# Patient Record
Sex: Male | Born: 1980 | Race: White | Hispanic: No | Marital: Single | Smoking: Former smoker
Health system: Southern US, Community
[De-identification: ages and names within clinical notes are randomized; demographics above are authoritative.]

## PROBLEM LIST (undated history)

## (undated) DIAGNOSIS — C6291 Malignant neoplasm of right testis, unspecified whether descended or undescended: Secondary | ICD-10-CM

## (undated) DIAGNOSIS — IMO0002 Reserved for concepts with insufficient information to code with codable children: Secondary | ICD-10-CM

## (undated) DIAGNOSIS — C779 Secondary and unspecified malignant neoplasm of lymph node, unspecified: Secondary | ICD-10-CM

## (undated) DIAGNOSIS — N5089 Other specified disorders of the male genital organs: Secondary | ICD-10-CM

---

## 2003-06-29 ENCOUNTER — Emergency Department (HOSPITAL_COMMUNITY): Admission: EM | Admit: 2003-06-29 | Discharge: 2003-06-29 | Payer: Self-pay | Admitting: Emergency Medicine

## 2004-02-16 ENCOUNTER — Emergency Department (HOSPITAL_COMMUNITY): Admission: EM | Admit: 2004-02-16 | Discharge: 2004-02-16 | Payer: Self-pay | Admitting: Emergency Medicine

## 2006-02-14 ENCOUNTER — Emergency Department (HOSPITAL_COMMUNITY): Admission: EM | Admit: 2006-02-14 | Discharge: 2006-02-14 | Payer: Self-pay | Admitting: Emergency Medicine

## 2007-09-24 ENCOUNTER — Emergency Department (HOSPITAL_COMMUNITY): Admission: EM | Admit: 2007-09-24 | Discharge: 2007-09-24 | Payer: Self-pay | Admitting: Emergency Medicine

## 2007-09-25 ENCOUNTER — Emergency Department (HOSPITAL_COMMUNITY): Admission: EM | Admit: 2007-09-25 | Discharge: 2007-09-26 | Payer: Self-pay | Admitting: Emergency Medicine

## 2007-10-15 ENCOUNTER — Emergency Department (HOSPITAL_COMMUNITY): Admission: EM | Admit: 2007-10-15 | Discharge: 2007-10-15 | Payer: Self-pay | Admitting: Emergency Medicine

## 2008-03-08 ENCOUNTER — Emergency Department (HOSPITAL_COMMUNITY): Admission: EM | Admit: 2008-03-08 | Discharge: 2008-03-08 | Payer: Self-pay | Admitting: Emergency Medicine

## 2008-09-11 ENCOUNTER — Emergency Department (HOSPITAL_COMMUNITY): Admission: EM | Admit: 2008-09-11 | Discharge: 2008-09-11 | Payer: Self-pay | Admitting: Emergency Medicine

## 2008-09-11 ENCOUNTER — Encounter: Payer: Self-pay | Admitting: Orthopedic Surgery

## 2008-09-23 ENCOUNTER — Emergency Department (HOSPITAL_COMMUNITY): Admission: EM | Admit: 2008-09-23 | Discharge: 2008-09-23 | Payer: Self-pay | Admitting: Emergency Medicine

## 2008-10-11 ENCOUNTER — Ambulatory Visit: Payer: Self-pay | Admitting: Orthopedic Surgery

## 2008-10-11 DIAGNOSIS — S93409A Sprain of unspecified ligament of unspecified ankle, initial encounter: Secondary | ICD-10-CM | POA: Insufficient documentation

## 2008-10-19 ENCOUNTER — Encounter: Payer: Self-pay | Admitting: Orthopedic Surgery

## 2008-12-04 ENCOUNTER — Emergency Department (HOSPITAL_COMMUNITY): Admission: EM | Admit: 2008-12-04 | Discharge: 2008-12-04 | Payer: Self-pay | Admitting: Emergency Medicine

## 2009-01-27 ENCOUNTER — Emergency Department (HOSPITAL_COMMUNITY): Admission: EM | Admit: 2009-01-27 | Discharge: 2009-01-27 | Payer: Self-pay | Admitting: Emergency Medicine

## 2009-10-06 ENCOUNTER — Emergency Department (HOSPITAL_COMMUNITY): Admission: EM | Admit: 2009-10-06 | Discharge: 2009-10-06 | Payer: Self-pay | Admitting: Emergency Medicine

## 2009-12-21 ENCOUNTER — Emergency Department (HOSPITAL_COMMUNITY): Admission: EM | Admit: 2009-12-21 | Discharge: 2009-12-22 | Payer: Self-pay | Admitting: Emergency Medicine

## 2010-03-05 ENCOUNTER — Emergency Department (HOSPITAL_COMMUNITY): Admission: EM | Admit: 2010-03-05 | Discharge: 2010-03-05 | Payer: Self-pay | Admitting: Emergency Medicine

## 2010-03-18 ENCOUNTER — Emergency Department (HOSPITAL_COMMUNITY): Admission: EM | Admit: 2010-03-18 | Discharge: 2010-03-19 | Payer: Self-pay | Admitting: Emergency Medicine

## 2010-11-26 LAB — GC/CHLAMYDIA PROBE AMP, GENITAL: GC Probe Amp, Genital: NEGATIVE

## 2010-11-26 LAB — WOUND CULTURE

## 2010-12-17 LAB — RAPID STREP SCREEN (MED CTR MEBANE ONLY): Streptococcus, Group A Screen (Direct): POSITIVE — AB

## 2010-12-19 LAB — DIFFERENTIAL
Monocytes Relative: 11 % (ref 3–12)
Neutro Abs: 7.9 10*3/uL — ABNORMAL HIGH (ref 1.7–7.7)

## 2010-12-19 LAB — MONONUCLEOSIS SCREEN: Mono Screen: NEGATIVE

## 2010-12-19 LAB — CBC
HCT: 41.8 % (ref 39.0–52.0)
Hemoglobin: 14.6 g/dL (ref 13.0–17.0)
Platelets: 154 10*3/uL (ref 150–400)
RBC: 4.81 MIL/uL (ref 4.22–5.81)
WBC: 11.2 10*3/uL — ABNORMAL HIGH (ref 4.0–10.5)

## 2010-12-19 LAB — RAPID STREP SCREEN (MED CTR MEBANE ONLY): Streptococcus, Group A Screen (Direct): NEGATIVE

## 2013-06-13 ENCOUNTER — Encounter (HOSPITAL_COMMUNITY): Payer: Self-pay | Admitting: *Deleted

## 2013-06-13 ENCOUNTER — Emergency Department (HOSPITAL_COMMUNITY)
Admission: EM | Admit: 2013-06-13 | Discharge: 2013-06-13 | Disposition: A | Discharge status: Home / Self Care | Payer: BC Managed Care – PPO | Attending: Emergency Medicine | Admitting: Emergency Medicine

## 2013-06-13 DIAGNOSIS — Y9389 Activity, other specified: Secondary | ICD-10-CM | POA: Insufficient documentation

## 2013-06-13 DIAGNOSIS — F17200 Nicotine dependence, unspecified, uncomplicated: Secondary | ICD-10-CM | POA: Insufficient documentation

## 2013-06-13 DIAGNOSIS — X500XXA Overexertion from strenuous movement or load, initial encounter: Secondary | ICD-10-CM | POA: Insufficient documentation

## 2013-06-13 DIAGNOSIS — Y9289 Other specified places as the place of occurrence of the external cause: Secondary | ICD-10-CM | POA: Insufficient documentation

## 2013-06-13 DIAGNOSIS — M549 Dorsalgia, unspecified: Secondary | ICD-10-CM

## 2013-06-13 DIAGNOSIS — IMO0002 Reserved for concepts with insufficient information to code with codable children: Secondary | ICD-10-CM | POA: Insufficient documentation

## 2013-06-13 DIAGNOSIS — Z8739 Personal history of other diseases of the musculoskeletal system and connective tissue: Secondary | ICD-10-CM | POA: Insufficient documentation

## 2013-06-13 DIAGNOSIS — Y990 Civilian activity done for income or pay: Secondary | ICD-10-CM | POA: Insufficient documentation

## 2013-06-13 HISTORY — DX: Reserved for concepts with insufficient information to code with codable children: IMO0002

## 2013-06-13 MED ORDER — HYDROCODONE-ACETAMINOPHEN 5-325 MG PO TABS: 2.0000 | ORAL_TABLET | ORAL | Status: DC | PRN

## 2013-06-13 MED ORDER — PREDNISONE 10 MG PO TABS: ORAL_TABLET | ORAL | Status: DC

## 2013-06-13 NOTE — ED Notes (Signed)
Pain rt buttock and down rt leg . Pt was pushing a wheel barrow with gravel in it.  Wheel barrow turned over and pt hurt his back app230p.    Took motrin without relief.

## 2013-06-13 NOTE — ED Notes (Signed)
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 States hx of back pain but not since 2012, from herniated disc.

## 2013-06-13 NOTE — ED Provider Notes (Signed)
CSN: [id number]     Arrival date & time 06/13/13  1558 History   First MD Initiated Contact with Patient 06/13/13 1722     Chief Complaint  Patient presents with  . Back Pain   (Consider location/radiation/quality/duration/timing/severity/associated sxs/prior Treatment) Patient is a 32 y.o. male presenting with back pain. The history is provided by the patient. No language interpreter was used.  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  Pt reports pain down leg today while working.   Past Medical History  Diagnosis Date  . Herniated disc    History reviewed. No pertinent past surgical history. No family history on file. History  Substance Use Topics  . Smoking status: Current Every Day Smoker    Types: Cigarettes  . Smokeless tobacco: Not on file  . Alcohol Use: No    Review of Systems  Musculoskeletal: Positive for back pain.  All other systems reviewed and are negative.    Allergies  Review of patient's allergies indicates no known allergies.  Home Medications  No current outpatient prescriptions on file. BP 139/96  Pulse 87  Temp(Src) 97.6 F (36.4 C) (Oral)  Resp 18  Ht 5\' 10"  (1.778 m)  Wt 185 lb (83.915 kg)  BMI 26.54 kg/m2  SpO2 100% Physical Exam  Constitutional: He appears well-developed and well-nourished.  HENT:  Head: Normocephalic.  Cardiovascular: Normal rate.   Pulmonary/Chest: Effort normal.  Abdominal: Soft.  Musculoskeletal: Normal range of motion. He exhibits no tenderness.  Tender lower ls spine  Neurological: He is alert. He has normal reflexes.  Skin: Skin is warm.  Psychiatric: He has a normal mood and affect.     ED Course  Procedures (including critical care time) Labs Review Labs Reviewed - No data to display Imaging Review No results found.  MDM   1. Back pain   prednisone taper,  hydrocodone    Elson Areas, PA-C 06/13/13 2131

## 2013-06-15 NOTE — ED Provider Notes (Signed)
Medical screening examination/treatment/procedure(s) were performed by non-physician practitioner and as supervising physician I was immediately available for consultation/collaboration.   Delphin Funes L Darlean Warmoth, MD 06/15/13 0804 

## 2014-01-25 ENCOUNTER — Emergency Department (HOSPITAL_COMMUNITY): Payer: BC Managed Care – PPO

## 2014-01-25 ENCOUNTER — Encounter (HOSPITAL_COMMUNITY): Payer: Self-pay | Admitting: Emergency Medicine

## 2014-01-25 ENCOUNTER — Emergency Department (HOSPITAL_COMMUNITY)
Admission: EM | Admit: 2014-01-25 | Discharge: 2014-01-25 | Disposition: A | Discharge status: Home / Self Care | Payer: BC Managed Care – PPO | Attending: Emergency Medicine | Admitting: Emergency Medicine

## 2014-01-25 ENCOUNTER — Emergency Department (HOSPITAL_COMMUNITY): Payer: Self-pay

## 2014-01-25 DIAGNOSIS — Z8739 Personal history of other diseases of the musculoskeletal system and connective tissue: Secondary | ICD-10-CM | POA: Insufficient documentation

## 2014-01-25 DIAGNOSIS — F17200 Nicotine dependence, unspecified, uncomplicated: Secondary | ICD-10-CM | POA: Insufficient documentation

## 2014-01-25 DIAGNOSIS — M545 Low back pain, unspecified: Secondary | ICD-10-CM | POA: Insufficient documentation

## 2014-01-25 DIAGNOSIS — R5381 Other malaise: Secondary | ICD-10-CM | POA: Insufficient documentation

## 2014-01-25 DIAGNOSIS — IMO0002 Reserved for concepts with insufficient information to code with codable children: Secondary | ICD-10-CM | POA: Insufficient documentation

## 2014-01-25 DIAGNOSIS — R5383 Other fatigue: Secondary | ICD-10-CM

## 2014-01-25 DIAGNOSIS — Z791 Long term (current) use of non-steroidal anti-inflammatories (NSAID): Secondary | ICD-10-CM | POA: Insufficient documentation

## 2014-01-25 DIAGNOSIS — Z79899 Other long term (current) drug therapy: Secondary | ICD-10-CM | POA: Insufficient documentation

## 2014-01-25 DIAGNOSIS — N508 Other specified disorders of male genital organs: Secondary | ICD-10-CM | POA: Insufficient documentation

## 2014-01-25 DIAGNOSIS — N5089 Other specified disorders of the male genital organs: Secondary | ICD-10-CM

## 2014-01-25 LAB — URINALYSIS, ROUTINE W REFLEX MICROSCOPIC
Bilirubin Urine: NEGATIVE
GLUCOSE, UA: NEGATIVE mg/dL
HGB URINE DIPSTICK: NEGATIVE
Ketones, ur: NEGATIVE mg/dL
LEUKOCYTES UA: NEGATIVE
NITRITE: NEGATIVE
PROTEIN: NEGATIVE mg/dL
SPECIFIC GRAVITY, URINE: 1.01 (ref 1.005–1.030)
Urobilinogen, UA: 0.2 mg/dL (ref 0.0–1.0)
pH: 5.5 (ref 5.0–8.0)

## 2014-01-25 NOTE — ED Provider Notes (Signed)
CSN: [id number]     Arrival date & time 01/25/14  1306 History  This chart was scribed for Jeremy Norrie, MD by Ludger Nutting, ED Scribe. This patient was seen in room APA18/APA18 and the patient's care was started 2:10 PM.    Chief Complaint  Patient presents with  . Groin Swelling  . Back Pain      The history is provided by the patient. No language interpreter was used.    HPI Comments: Jeremy Clay is a 33 y.o. male who presents to the Emergency Department complaining of 8-9 months of constant, worsening right testicular swelling and hardness. He denies having any testicular pain at any point since onset.    [place] has had LBP [place] showed a herniated disc with pain radiating down his right leg, which is now gone. He does however continue to have LBP.   He also reports he noticed moveable lumps to the lower right back which he states are not painful. He states the back pain is usually resolved with ibuprofen. He has a herniated disc which occasionally causes him to have radiating pain to the right leg. He states his current back pain is different from this. Patient states he also noticed knots to the lower abdomen in the last couple of weeks. He reports decreased urine output with dark colored urine but states he has been drinking adequate water. He also reports becoming easily fatigued while working. He denies nausea, vomiting, decreased appetite, fever, chills.   No PCP   Past Medical History  Diagnosis Date  . Herniated disc    History reviewed. No pertinent past surgical history. Family History  Problem Relation Age of Onset  . Diabetes Other   . Heart failure Other    History  Substance Use Topics  . Smoking status: Current Every Day Smoker -- 0.50 packs/day for 13 years    Types: Cigarettes  . Smokeless tobacco: Never Used  . Alcohol Use: No  employed at a dog [employer] He smokes a 0.5 ppd and no longer drinks  alcohol. He has history of cocaine and marijuana use. He states last use of these drugs was about 6 months ago.   Review of Systems  Constitutional: Positive for fatigue. Negative for fever, chills and appetite change.  Gastrointestinal: Negative for nausea and vomiting.  Genitourinary: Positive for decreased urine volume and scrotal swelling.  Musculoskeletal: Positive for back pain.  All other systems reviewed and are negative.     Allergies  Review of patient's allergies indicates no known allergies.  Home Medications   Prior to Admission medications   Medication Sig Start Date End Date Taking? Authorizing Provider  Aspirin-Acetaminophen (GOODYS BODY PAIN PO) Take 1 packet by mouth daily as needed (pain).   Yes Historical Provider, MD  ibuprofen (ADVIL,MOTRIN) 200 MG tablet Take 600 mg by mouth every 6 (six) hours as needed for moderate pain.   Yes Historical Provider, MD  naproxen sodium (ANAPROX) 220 MG tablet Take 220 mg by mouth 2 (two) times daily with a meal.   Yes Historical Provider, MD  predniSONE (DELTASONE) 10 MG tablet 6,6,5,5,4,4,3,3,2,2,1,1 06/13/13   Fransico Meadow, PA-C   BP 135/83  Pulse 77  Temp(Src) 98.3 F (36.8 C) (Oral)  Resp 16  Ht 5\' 10"  (1.778 m)  Wt 185 lb (83.915 kg)  BMI 26.54 kg/m2  SpO2 100%  Vital signs normal   Physical Exam  Nursing note and vitals reviewed. Constitutional: He is oriented to person, place, and time. He appears well-developed and well-nourished.  Non-toxic appearance. He does not appear ill. No distress.  HENT:  Head: Normocephalic and atraumatic.  Right Ear: External ear normal.  Left Ear: External ear normal.  Nose: Nose normal. No mucosal edema or rhinorrhea.  Mouth/Throat: Oropharynx is clear and moist and mucous membranes are normal. No dental abscesses or uvula swelling.  Eyes: Conjunctivae and EOM are normal. Pupils are equal, round, and reactive to light.  Neck: Normal range of motion and full passive range of  motion without pain. Neck supple.  Cardiovascular: Normal rate, regular rhythm and normal heart sounds.  Exam reveals no gallop and no friction rub.   No murmur heard. Pulmonary/Chest: Effort normal and breath sounds normal. No respiratory distress. He has no wheezes. He has no rhonchi. He has no rales. He exhibits no tenderness and no crepitus.  Abdominal: Soft. Normal appearance and bowel sounds are normal. He exhibits no distension. There is no tenderness. There is no rebound and no guarding.  Genitourinary:  Right testicle is large and swollen, approximately the size of an orange. It is firm, smooth and transilluminates in small area. Left testicle feels normal and is normal size.    Musculoskeletal: Normal range of motion. He exhibits no edema and no tenderness.       Back:  Moves all extremities well.   Neurological: He is alert and oriented to person, place, and time. He has normal strength. No cranial nerve deficit.  Skin: Skin is warm, dry and intact. No rash noted. No erythema. No pallor.  Patient has trouble finding the knots in his abdomen. He did finally find one which felt like a firm linear growth in the subcutaneous tissue consistent with a fatty deposit. He also had one in the back area on the left lower lumbar region.  Psychiatric: He has a normal mood and affect. His speech is normal and behavior is normal. His mood appears not anxious.    ED Course  Procedures (including critical care time)  DIAGNOSTIC STUDIES: Oxygen Saturation is 100% on RA, normal by my interpretation.    COORDINATION OF CARE: 2:10 PM Discussed treatment plan with pt at bedside and pt agreed to plan.  16:33 Dr Thornton Papas, called [place] result.   16:47-18:25 no response from Dr Michela Pitcher  18:17 Dr Ronette Deter NP took his information and the office will call him in the morning.    Labs Review Results for orders placed during the hospital encounter of 01/25/14  URINALYSIS, ROUTINE W REFLEX MICROSCOPIC       Result Value Ref Range   Color, Urine YELLOW  YELLOW   APPearance CLEAR  CLEAR   Specific Gravity, Urine 1.010  1.005 - 1.030   pH 5.5  5.0 - 8.0   Glucose, UA NEGATIVE  NEGATIVE mg/dL   Hgb urine dipstick NEGATIVE  NEGATIVE   Bilirubin Urine NEGATIVE  NEGATIVE   Ketones, ur NEGATIVE  NEGATIVE mg/dL   Protein, ur NEGATIVE  NEGATIVE mg/dL   Urobilinogen, UA 0.2  0.0 - 1.0 mg/dL   Nitrite NEGATIVE  NEGATIVE   Leukocytes, UA NEGATIVE  NEGATIVE   Laboratory interpretation all normal    Imaging Review US Scrotum  US Art/ven Flow Abd Pelv Doppler  01/25/2014   CLINICAL DATA:  RIGHT testicular enlargement for 9 months, nonpainful, hard smooth feel on palpation, groin swelling  EXAM: SCROTAL ULTRASOUND  DOPPLER ULTRASOUND OF THE TESTICLES  TECHNIQUE:  Complete ultrasound examination of the testicles, epididymis, and other scrotal structures was performed. Color and spectral Doppler ultrasound were also utilized to evaluate blood flow to the testicles.  COMPARISON:  None.  FINDINGS: Right testicle  Measurements: Enlarged, 9.1 x 6.1 x 8.4 cm. Abnormal appearance, demonstrating extensive microlithiasis, cystic areas, solid components, and areas of mixed isoechogenicity and hypoechogenicity. Largest cystic component 2.3 x 1.3 x 2.2 cm. Largest discrete solid component measures 1.7 x 1.9 x 1.6 cm. Blood flow present within the enlarged RIGHT testis on color Doppler imaging.  Left testicle  Measurements: 4.7 x 2.2 x 3.8 cm. Diffuse microlithiasis. No discrete mass. Internal blood flow present on color Doppler imaging.  Right epididymis:  Not visualized  Left epididymis:  Normal in size and appearance.  Hydrocele:  Small LEFT hydrocele.  No RIGHT hydrocele.  Varicocele:  No definite varicocele identified  Pulsed Doppler interrogation of both testes demonstrates low resistance arterial and venous waveforms in the LEFT testis. Low resistance arterial and venous waveforms are present within the enlarged abnormal  appearing RIGHT testis as well.  IMPRESSION: Normal LEFT testis.  Small LEFT hydrocele.  Markedly enlarged heterogeneous RIGHT testis 9.1 x 6.1 x 8.4 cm containing solid areas, cystic areas, microcalcifications, and nonspecific heterogeneous areas of mixed echogenicity.  Lesion is highly suspicious for testicular neoplasm.  Appearance is atypical for advanced tubular ectasia of the rete testis.  Granulomatous processes could cause a similar appearance but considered much less likely.  Urologic evaluation recommended.  Findings called to Dr.  Rolland Porter on 01/25/2014 at 1635 hr.   Electronically Signed   By: Lavonia Dana M.D.   On: 01/25/2014 16:43     EKG Interpretation None      MDM   Final diagnoses:  Testicular mass    Plan discharge  Rolland Porter, MD, FACEP    I personally performed the services described in this documentation, which was scribed in my presence. The recorded information has been reviewed and considered.  Rolland Porter, MD, Abram Sander   Jeremy Norrie, MD 01/25/14 2141

## 2014-01-25 NOTE — ED Notes (Signed)
Patient c/o right testicle swelling x8 months that has progressively gotten worse. Patient denies any testicular pain but reports pain in lower back. Per patient has hx of herniated disc but states pain does not feel the same.

## 2014-01-25 NOTE — Discharge Instructions (Signed)
You should hear from [facility] in the morning about an appointment to be seen about getting further treatment of the mass in your right testicle. Return to the ED if you get pain in your testicle.     Scrotal Masses Scrotal swelling is common in men of all ages. Common types of testicular masses include:   Hydrocele. The most common benign testicular mass in an adult. Hydroceles are generally soft and painless collections of fluid in the scrotal sac. These can rapidly change size as the fluid enters or leaves. Hydroceles can be associated with an underlying cancer of the testicle.  Spermatoceles. Generally soft and painless cyst-like masses in the scrotum that contain fluid, usually above the testicle. They can rapidly change size as the fluid enters or leaves. They are more prominent while standing or exercising. Sometimes, spermatoceles may cause a sensation of heaviness or a dull ache.  Orchitis. Inflammation of the testicle. It is painful and may be associated with a fever or symptoms of a urinary tract infection, including frequent and painful urination. It is common in males who have the mumps.  Varicocele. An enlargement of the veins that drain the testicles. Varicoceles usually occur on the left side of the scrotum. This condition can increase the risk of infertility. Varicocele is sometimes more prominent while standing or exercising. Sometimes, varicoceles may cause a sensation of heaviness or a dull ache.  Inguinal hernia. A bulge caused by a portion of intestine protruding into the scrotum through a weak area in the abdominal muscles. Hernias may or may not be painful. They are soft and usually enlarge with coughing or straining.  Torsion of the testis. This can cause a testicular mass that develops quickly and is associated with tenderness or fever, or both. It is caused by a twisting of the testicle within the sac. It also reduces the blood supply and can destroy the testis if  not treated quickly with surgery.  Epididymitis. Inflammation of the epididymis (a structure attached above and behind the testicle), usually caused by a urinary tract infection or a sexually transmitted infection. This generally shows up as testicular discomfort and swelling and may include pain during urination. It is frequently associated with a testicle infection.  Testicular appendages. Remnants of tissue on the testis present since birth. A testicular appendage can twist on its blood supply and cause pain. In most cases, this is seen as a blue dot on the scrotum.  Hematocele. A collection of blood between the layers of the sac inside the scrotum. It usually is caused by trauma to the scrotum.  Sebaceous cysts. These can be a swelling in the skin of the scrotum and are usually painless.  Cancer (carcinoma) of the skin of the scrotum. It can cause scrotal swelling, but this is rare. Document Released: 03/01/2003 Document Revised: 04/27/2013 Document Reviewed: 02/14/2013 [facility] Patient Information 2014 [place].

## 2014-01-27 ENCOUNTER — Other Ambulatory Visit: Payer: Self-pay | Admitting: Urology

## 2014-01-27 ENCOUNTER — Ambulatory Visit (INDEPENDENT_AMBULATORY_CARE_PROVIDER_SITE_OTHER): Payer: Self-pay | Admitting: Urology

## 2014-01-27 DIAGNOSIS — N508 Other specified disorders of male genital organs: Secondary | ICD-10-CM

## 2014-01-27 DIAGNOSIS — N478 Other disorders of prepuce: Secondary | ICD-10-CM

## 2014-01-27 DIAGNOSIS — N471 Phimosis: Secondary | ICD-10-CM

## 2014-01-31 ENCOUNTER — Other Ambulatory Visit: Payer: Self-pay | Admitting: Urology

## 2014-01-31 ENCOUNTER — Ambulatory Visit (HOSPITAL_COMMUNITY)
Admission: RE | Admit: 2014-01-31 | Discharge: 2014-01-31 | Disposition: A | Discharge status: Home / Self Care | Payer: Self-pay | Source: Ambulatory Visit | Attending: Urology | Admitting: Urology

## 2014-01-31 DIAGNOSIS — N508 Other specified disorders of male genital organs: Secondary | ICD-10-CM

## 2014-02-02 ENCOUNTER — Telehealth: Payer: Self-pay | Admitting: Oncology

## 2014-02-02 ENCOUNTER — Encounter (HOSPITAL_BASED_OUTPATIENT_CLINIC_OR_DEPARTMENT_OTHER): Payer: Self-pay | Admitting: *Deleted

## 2014-02-02 ENCOUNTER — Ambulatory Visit (HOSPITAL_COMMUNITY)
Admission: RE | Admit: 2014-02-02 | Discharge: 2014-02-02 | Disposition: A | Discharge status: Home / Self Care | Payer: Self-pay | Source: Ambulatory Visit | Attending: Urology | Admitting: Urology

## 2014-02-02 DIAGNOSIS — C6290 Malignant neoplasm of unspecified testis, unspecified whether descended or undescended: Secondary | ICD-10-CM | POA: Insufficient documentation

## 2014-02-02 DIAGNOSIS — C772 Secondary and unspecified malignant neoplasm of intra-abdominal lymph nodes: Secondary | ICD-10-CM | POA: Insufficient documentation

## 2014-02-02 DIAGNOSIS — N508 Other specified disorders of male genital organs: Secondary | ICD-10-CM

## 2014-02-02 MED ORDER — IOHEXOL 300 MG/ML  SOLN
100.0000 mL | Freq: Once | INTRAMUSCULAR | Status: AC | PRN
  Administered 2014-02-02: 100 mL via INTRAVENOUS

## 2014-02-02 NOTE — Telephone Encounter (Signed)
C/D 02/02/14 for appt. 02/22/14 °

## 2014-02-02 NOTE — Telephone Encounter (Signed)
LEFT MESSAGE FOR PATIENT TO RETURN CALL TO SCHEDULE NP APPT.  °

## 2014-02-02 NOTE — Telephone Encounter (Signed)
S/W PATIENT AND GAVE NP APPT FOR 06/17 @ 10:30 W/DR. SHADAD.  Iron DX- TESTICULAR CA W/RETROPERITIONAL NODE WELCOME PACKET MAILED.

## 2014-02-03 ENCOUNTER — Other Ambulatory Visit: Payer: Self-pay | Admitting: Urology

## 2014-02-06 NOTE — H&P (Signed)
1. Phimosis (605)  2. Testicular mass (608.89)  History of Present Illness   Jeremy Clay is a 33 yo WM who is sent from [facility] for a right testicular mass.   He was seen in the ER and was found to have a 9cm right testicular mass that is supicious for cancer.   He has had no pain but has noticed the swelling for the last six months.  He has had no voiding complaints.  He has had no inguinal swelling.  he has noticed a spot under the skin on the right lower back.   He has had no weight loss or malaise.   Past Medical History  1. History of Herniated lumbar intervertebral disc (722.10)  2. History of balanitis (V13.89)  3. History of bronchitis (V12.69)  4. History of hypertension (V12.59)  5. History of methicillin resistant Staphylococcus aureus infection (V12.04)  Surgical History  1. History of No Surgical Problems  Current Meds  1. Ibuprofen TABS;  Therapy: (Recorded:22May2015) to Recorded  Allergies  1. No Known Drug Allergies  Family History  1. Family history of Death : Father  Social History   Caffeine use (V49.89)   4 p/d   Occupation   painting   Single   Smokes cigarettes (305.1)   1/2 pk. p/d on and off 17 yrs.  Review of Systems Genitourinary, constitutional, skin, eye, otolaryngeal, hematologic/lymphatic, cardiovascular, pulmonary, endocrine, musculoskeletal, gastrointestinal, neurological and psychiatric system(s) were reviewed and pertinent findings if present are noted.  Genitourinary: post-void dribbling and testicular mass.  Respiratory: shortness of breath and cough.  Musculoskeletal: back pain (achy).  Neurological: dizziness.  Psychiatric: anxiety.    Vitals Vital Signs [Data Includes: Last 1 Day]  Recorded: [id number] 01:43PM  Height: 5 ft 10 in Weight: 185 lb  BMI Calculated: 26.54 BSA Calculated: 2.02 Blood Pressure: 166 / 138 Temperature: 97.7 F Heart Rate: 84  Physical Exam Constitutional: Well nourished and well developed . No  acute distress.  ENT:. The ears and nose are normal in appearance.  Neck: The appearance of the neck is normal and no neck mass is present.  Pulmonary: No respiratory distress and normal respiratory rhythm and effort.  Cardiovascular: Heart rate and rhythm are normal . No peripheral edema.  Abdomen: The abdomen is soft and nontender. No masses are palpated. No CVA tenderness. No hernias are palpable. No hepatosplenomegaly noted.  Genitourinary: Examination of the penis demonstrates phimosis (moderately severe. Can't retract beyond the glans. ), but no discharge, no masses, no lesions and a normal meatus. The penis is uncircumcised. The scrotum is without lesions. The left epididymis is palpably normal and non-tender. The right testis is found to have a 9 cm right testicular mass, but non-tender. The left testis is non-tender and without masses.  Lymphatics: The posterior cervical, supraclavicular, axillary, femoral and inguinal nodes are not enlarged or tender.  Skin: Normal skin turgor, no visible rash and no visible skin lesions.  Neuro/Psych:. Mood and affect are appropriate.    Results/Data Urine [Data Includes: Last 1 Day]   [id number]  COLOR YELLOW   APPEARANCE CLEAR   SPECIFIC GRAVITY >1.030   pH 6.0   GLUCOSE NEG mg/dL  BILIRUBIN NEG   KETONE NEG mg/dL  BLOOD NEG   PROTEIN TRACE mg/dL  UROBILINOGEN 0.2 mg/dL  NITRITE NEG   LEUKOCYTE ESTERASE NEG    Old records or history reviewed: I have reviewed his ER records.  The following images/tracing/specimen were independently visualized:  Scrotal US and report  reviewed.  The following clinical lab reports were reviewed:  UA reviewed.    Assessment  1. Testicular mass (608.89)  2. 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 He has moderately severe phimosis.   Plan Balanitis   1. UA With REFLEX; [Do Not Release]; Status:Resulted - Requires Verification;   Done:  [id number]  01:55PM Testicular mass   2. ALPHA-FETOPROTIEN (TUMOR MARKER); Status:Hold For - Specimen/Data  Collection,Appointment; Requested for:22May2015;   3. BETA HCG TUMOR MARKER; Status:Hold For - Specimen/Data Collection,Appointment;  Requested for:22May2015;   4. CBC W/DIFF; Status:Hold For - Specimen/Data Collection,Appointment; Requested  for:22May2015;   5. CHEST X-RAY; Status:Hold For - Appointment,Print,Records,Given To Patient; Requested  for:22May2015;   6. COMPREHENSIVE METABOLIC PANEL; Status:Hold For - Specimen/Data  Collection,Appointment; Requested for:22May2015;   7. CT-ABD/PELVIS WITH CONTRAST; Status:Hold For - Appointment,PreCert,Date of  Service,Print; Requested for:22May2015;   8. Follow-up Schedule Surgery Office  Follow-up  Status: Hold For - Appointment   Requested for: [phone]   I am going to get an AFP and BHCG along with a CBC and CMP. He will be set up for a CXR and CT AP with contrast. He will be scheduled for a right radical orchiectomy and is not interested in a prosthesis or circumcision at this time.   I reviewed the risks of bleeding, infection, hernia, nerve injury, fertility issues, need for additional treatment or surgery, thrombotic events and anesthetic complications.

## 2014-02-06 NOTE — Progress Notes (Addendum)
UNABLE TO [place] CELL #[id number]. NPO AFTER MN. ARRIVE AT 0600.

## 2014-02-06 NOTE — Anesthesia Preprocedure Evaluation (Signed)
Anesthesia Evaluation  Patient identified by MRN, date of birth, ID band Patient awake    Reviewed: Allergy & Precautions, H&P , NPO status , Patient's Chart, lab work & pertinent test results  Airway Mallampati: I TM Distance: >3 FB Neck ROM: Full    Dental  (+) Dental Advisory Given   Pulmonary Current Smoker,  breath sounds clear to auscultation        Cardiovascular negative cardio ROS  Rhythm:Regular Rate:Normal     Neuro/Psych negative neurological ROS  negative psych ROS   GI/Hepatic negative GI ROS, Neg liver ROS,   Endo/Other  negative endocrine ROS  Renal/GU negative Renal ROS     Musculoskeletal negative musculoskeletal ROS (+)   Abdominal   Peds  Hematology negative hematology ROS (+)   Anesthesia Other Findings   Reproductive/Obstetrics                           Anesthesia Physical Anesthesia Plan  ASA: II  Anesthesia Plan: General   Post-op Pain Management:    Induction: Intravenous  Airway Management Planned: LMA  Additional Equipment:   Intra-op Plan:   Post-operative Plan: Extubation in OR  Informed Consent: I have reviewed the patients History and Physical, chart, labs and discussed the procedure including the risks, benefits and alternatives for the proposed anesthesia with the patient or authorized representative who has indicated his/her understanding and acceptance.   Dental advisory given  Plan Discussed with: CRNA  Anesthesia Plan Comments:         Anesthesia Quick Evaluation

## 2014-02-07 ENCOUNTER — Ambulatory Visit (HOSPITAL_BASED_OUTPATIENT_CLINIC_OR_DEPARTMENT_OTHER): Payer: Self-pay | Admitting: Anesthesiology

## 2014-02-07 ENCOUNTER — Encounter (HOSPITAL_BASED_OUTPATIENT_CLINIC_OR_DEPARTMENT_OTHER): Payer: Self-pay | Admitting: *Deleted

## 2014-02-07 ENCOUNTER — Ambulatory Visit (HOSPITAL_BASED_OUTPATIENT_CLINIC_OR_DEPARTMENT_OTHER)
Admission: RE | Admit: 2014-02-07 | Discharge: 2014-02-07 | Disposition: A | Discharge status: Home / Self Care | Payer: Self-pay | Source: Ambulatory Visit | Attending: Urology | Admitting: Urology

## 2014-02-07 ENCOUNTER — Encounter (HOSPITAL_BASED_OUTPATIENT_CLINIC_OR_DEPARTMENT_OTHER)
Admission: RE | Disposition: A | Discharge status: Home / Self Care | Payer: Self-pay | Source: Ambulatory Visit | Attending: Urology

## 2014-02-07 ENCOUNTER — Encounter (HOSPITAL_BASED_OUTPATIENT_CLINIC_OR_DEPARTMENT_OTHER): Payer: Self-pay | Admitting: Anesthesiology

## 2014-02-07 DIAGNOSIS — M5126 Other intervertebral disc displacement, lumbar region: Secondary | ICD-10-CM | POA: Insufficient documentation

## 2014-02-07 DIAGNOSIS — C6291 Malignant neoplasm of right testis, unspecified whether descended or undescended: Secondary | ICD-10-CM | POA: Diagnosis present

## 2014-02-07 DIAGNOSIS — N476 Balanoposthitis: Secondary | ICD-10-CM | POA: Insufficient documentation

## 2014-02-07 DIAGNOSIS — N471 Phimosis: Secondary | ICD-10-CM | POA: Insufficient documentation

## 2014-02-07 DIAGNOSIS — I10 Essential (primary) hypertension: Secondary | ICD-10-CM | POA: Insufficient documentation

## 2014-02-07 DIAGNOSIS — F17200 Nicotine dependence, unspecified, uncomplicated: Secondary | ICD-10-CM | POA: Insufficient documentation

## 2014-02-07 DIAGNOSIS — C6290 Malignant neoplasm of unspecified testis, unspecified whether descended or undescended: Secondary | ICD-10-CM | POA: Insufficient documentation

## 2014-02-07 DIAGNOSIS — N478 Other disorders of prepuce: Secondary | ICD-10-CM | POA: Insufficient documentation

## 2014-02-07 DIAGNOSIS — C779 Secondary and unspecified malignant neoplasm of lymph node, unspecified: Secondary | ICD-10-CM | POA: Diagnosis present

## 2014-02-07 HISTORY — DX: Secondary and unspecified malignant neoplasm of lymph node, unspecified: C77.9

## 2014-02-07 HISTORY — DX: Malignant neoplasm of right testis, unspecified whether descended or undescended: C62.91

## 2014-02-07 HISTORY — DX: Other specified disorders of the male genital organs: N50.89

## 2014-02-07 HISTORY — PX: ORCHIECTOMY: SHX2116

## 2014-02-07 LAB — POCT HEMOGLOBIN-HEMACUE: Hemoglobin: 14.3 g/dL (ref 13.0–17.0)

## 2014-02-07 SURGERY — ORCHIECTOMY
Anesthesia: General | Site: Scrotum | Laterality: Right

## 2014-02-07 MED ORDER — FENTANYL CITRATE 0.05 MG/ML IJ SOLN
25.0000 ug | INTRAMUSCULAR | Status: DC | PRN
  Administered 2014-02-07 (×2): 50 ug via INTRAVENOUS
  Filled 2014-02-07: qty 1

## 2014-02-07 MED ORDER — PROMETHAZINE HCL 25 MG/ML IJ SOLN
6.2500 mg | INTRAMUSCULAR | Status: DC | PRN
  Filled 2014-02-07: qty 1

## 2014-02-07 MED ORDER — MIDAZOLAM HCL 2 MG/2ML IJ SOLN
INTRAMUSCULAR | Status: AC
  Filled 2014-02-07: qty 2

## 2014-02-07 MED ORDER — FENTANYL CITRATE 0.05 MG/ML IJ SOLN
INTRAMUSCULAR | Status: AC
  Filled 2014-02-07: qty 4

## 2014-02-07 MED ORDER — DEXAMETHASONE SODIUM PHOSPHATE 4 MG/ML IJ SOLN
INTRAMUSCULAR | Status: DC | PRN
  Administered 2014-02-07: 10 mg via INTRAVENOUS

## 2014-02-07 MED ORDER — HYDROCODONE-ACETAMINOPHEN 5-325 MG PO TABS: 1.0000 | ORAL_TABLET | Freq: Four times a day (QID) | ORAL | Status: DC | PRN

## 2014-02-07 MED ORDER — FENTANYL CITRATE 0.05 MG/ML IJ SOLN
INTRAMUSCULAR | Status: AC
  Filled 2014-02-07: qty 2

## 2014-02-07 MED ORDER — ONDANSETRON HCL 4 MG/2ML IJ SOLN
INTRAMUSCULAR | Status: DC | PRN
  Administered 2014-02-07: 4 mg via INTRAVENOUS

## 2014-02-07 MED ORDER — BUPIVACAINE HCL (PF) 0.25 % IJ SOLN
INTRAMUSCULAR | Status: DC | PRN
  Administered 2014-02-07: 9 mL

## 2014-02-07 MED ORDER — ACETAMINOPHEN 650 MG RE SUPP
650.0000 mg | RECTAL | Status: DC | PRN
  Filled 2014-02-07: qty 1

## 2014-02-07 MED ORDER — OXYCODONE HCL 5 MG PO TABS
ORAL_TABLET | ORAL | Status: AC
  Filled 2014-02-07: qty 1

## 2014-02-07 MED ORDER — OXYCODONE HCL 5 MG PO TABS
5.0000 mg | ORAL_TABLET | ORAL | Status: DC | PRN
  Filled 2014-02-07: qty 2

## 2014-02-07 MED ORDER — CEFAZOLIN SODIUM-DEXTROSE 2-3 GM-% IV SOLR
2.0000 g | INTRAVENOUS | Status: AC
  Administered 2014-02-07: 2 g via INTRAVENOUS
  Filled 2014-02-07: qty 50

## 2014-02-07 MED ORDER — ACETAMINOPHEN 10 MG/ML IV SOLN
INTRAVENOUS | Status: DC | PRN
  Administered 2014-02-07: 1000 mg via INTRAVENOUS

## 2014-02-07 MED ORDER — LIDOCAINE HCL (CARDIAC) 20 MG/ML IV SOLN
INTRAVENOUS | Status: DC | PRN
  Administered 2014-02-07: 100 mg via INTRAVENOUS

## 2014-02-07 MED ORDER — ACETAMINOPHEN 325 MG PO TABS
650.0000 mg | ORAL_TABLET | ORAL | Status: DC | PRN
  Filled 2014-02-07: qty 2

## 2014-02-07 MED ORDER — SODIUM CHLORIDE 0.9 % IJ SOLN
3.0000 mL | Freq: Two times a day (BID) | INTRAMUSCULAR | Status: DC
  Filled 2014-02-07: qty 3

## 2014-02-07 MED ORDER — PROPOFOL 10 MG/ML IV BOLUS
INTRAVENOUS | Status: DC | PRN
  Administered 2014-02-07: 200 mg via INTRAVENOUS

## 2014-02-07 MED ORDER — FENTANYL CITRATE 0.05 MG/ML IJ SOLN
INTRAMUSCULAR | Status: DC | PRN
  Administered 2014-02-07: 50 ug via INTRAVENOUS
  Administered 2014-02-07: 100 ug via INTRAVENOUS
  Administered 2014-02-07: 50 ug via INTRAVENOUS

## 2014-02-07 MED ORDER — MIDAZOLAM HCL 5 MG/5ML IJ SOLN
INTRAMUSCULAR | Status: DC | PRN
  Administered 2014-02-07: 2 mg via INTRAVENOUS

## 2014-02-07 MED ORDER — OXYCODONE HCL 5 MG PO TABS
5.0000 mg | ORAL_TABLET | Freq: Once | ORAL | Status: AC | PRN
  Administered 2014-02-07: 5 mg via ORAL
  Filled 2014-02-07: qty 1

## 2014-02-07 MED ORDER — MEPERIDINE HCL 25 MG/ML IJ SOLN
6.2500 mg | INTRAMUSCULAR | Status: DC | PRN
  Filled 2014-02-07: qty 1

## 2014-02-07 MED ORDER — SODIUM CHLORIDE 0.9 % IJ SOLN
3.0000 mL | INTRAMUSCULAR | Status: DC | PRN
  Filled 2014-02-07: qty 3

## 2014-02-07 MED ORDER — HYDROMORPHONE HCL PF 1 MG/ML IJ SOLN
0.2500 mg | INTRAMUSCULAR | Status: DC | PRN
  Filled 2014-02-07: qty 1

## 2014-02-07 MED ORDER — SODIUM CHLORIDE 0.9 % IR SOLN
Status: DC | PRN
  Administered 2014-02-07: 08:00:00

## 2014-02-07 MED ORDER — SODIUM CHLORIDE 0.9 % IV SOLN
250.0000 mL | INTRAVENOUS | Status: DC | PRN
  Filled 2014-02-07: qty 250

## 2014-02-07 MED ORDER — CEFAZOLIN SODIUM-DEXTROSE 2-3 GM-% IV SOLR
INTRAVENOUS | Status: AC
  Filled 2014-02-07: qty 50

## 2014-02-07 MED ORDER — OXYCODONE HCL 5 MG/5ML PO SOLN
5.0000 mg | Freq: Once | ORAL | Status: AC | PRN
  Filled 2014-02-07: qty 5

## 2014-02-07 MED ORDER — LACTATED RINGERS IV SOLN
INTRAVENOUS | Status: DC
  Administered 2014-02-07 (×2): via INTRAVENOUS
  Filled 2014-02-07: qty 1000

## 2014-02-07 SURGICAL SUPPLY — 62 items
APPLICATOR COTTON TIP 6IN STRL (MISCELLANEOUS) IMPLANT
BANDAGE GAUZE ELAST BULKY 4 IN (GAUZE/BANDAGES/DRESSINGS) ×3 IMPLANT
BLADE SURG 15 STRL LF DISP TIS (BLADE) ×1 IMPLANT
BLADE SURG 15 STRL SS (BLADE) ×2
BLADE SURG ROTATE 9660 (MISCELLANEOUS) ×3 IMPLANT
BNDG GAUZE ELAST 4 BULKY (GAUZE/BANDAGES/DRESSINGS) ×3 IMPLANT
CANISTER SUCTION 1200CC (MISCELLANEOUS) ×3 IMPLANT
CLEANER CAUTERY TIP 5X5 PAD (MISCELLANEOUS) ×1 IMPLANT
CLOTH BEACON ORANGE TIMEOUT ST (SAFETY) ×3 IMPLANT
COVER MAYO STAND STRL (DRAPES) ×3 IMPLANT
COVER TABLE BACK 60X90 (DRAPES) ×3 IMPLANT
DERMABOND ADVANCED (GAUZE/BANDAGES/DRESSINGS) ×2
DERMABOND ADVANCED .7 DNX12 (GAUZE/BANDAGES/DRESSINGS) ×1 IMPLANT
DISSECTOR ROUND CHERRY 3/8 STR (MISCELLANEOUS) IMPLANT
DRAIN PENROSE 18X1/4 LTX STRL (WOUND CARE) ×3 IMPLANT
DRAPE LAPAROTOMY TRNSV 102X78 (DRAPE) ×3 IMPLANT
DRSG TEGADERM 4X4.75 (GAUZE/BANDAGES/DRESSINGS) ×3 IMPLANT
ELECT NEEDLE TIP 2.8 STRL (NEEDLE) IMPLANT
ELECT REM PT RETURN 9FT ADLT (ELECTROSURGICAL) ×3
ELECTRODE REM PT RTRN 9FT ADLT (ELECTROSURGICAL) ×1 IMPLANT
GLOVE BIOGEL M STER SZ 6 (GLOVE) ×3 IMPLANT
GLOVE BIOGEL PI IND STRL 6.5 (GLOVE) ×2 IMPLANT
GLOVE BIOGEL PI IND STRL 7.5 (GLOVE) ×1 IMPLANT
GLOVE BIOGEL PI INDICATOR 6.5 (GLOVE) ×4
GLOVE BIOGEL PI INDICATOR 7.5 (GLOVE) ×2
GLOVE SURG SS PI 8.0 STRL IVOR (GLOVE) ×3 IMPLANT
GOWN PREVENTION PLUS LG XLONG (DISPOSABLE) IMPLANT
GOWN STRL REIN XL XLG (GOWN DISPOSABLE) IMPLANT
GOWN STRL REUS W/TWL LRG LVL3 (GOWN DISPOSABLE) ×3 IMPLANT
GOWN STRL REUS W/TWL XL LVL3 (GOWN DISPOSABLE) ×3 IMPLANT
NEEDLE HYPO 22GX1.5 SAFETY (NEEDLE) ×3 IMPLANT
NS IRRIG 500ML POUR BTL (IV SOLUTION) IMPLANT
PACK BASIN DAY SURGERY FS (CUSTOM PROCEDURE TRAY) ×3 IMPLANT
PAD CLEANER CAUTERY TIP 5X5 (MISCELLANEOUS) ×2
PENCIL BUTTON HOLSTER BLD 10FT (ELECTRODE) ×3 IMPLANT
SET COLLECT BLD 21X3/4 12 (NEEDLE) IMPLANT
SPONGE GAUZE 4X4 12PLY (GAUZE/BANDAGES/DRESSINGS) ×3 IMPLANT
SUPPORT SCROTAL LG STRP (MISCELLANEOUS) IMPLANT
SUPPORTER ATHLETIC LG (MISCELLANEOUS)
SUT CHROMIC 3 0 SH 27 (SUTURE) ×3 IMPLANT
SUT CHROMIC GUT AB #0 18 (SUTURE) IMPLANT
SUT MNCRL AB 4-0 PS2 18 (SUTURE) ×3 IMPLANT
SUT PROLENE 2 0 CT2 30 (SUTURE) IMPLANT
SUT SILK 3 0 TIES 17X18 (SUTURE)
SUT SILK 3-0 18XBRD TIE BLK (SUTURE) IMPLANT
SUT VIC AB 3-0 SH 27 (SUTURE) ×2
SUT VIC AB 3-0 SH 27X BRD (SUTURE) ×1 IMPLANT
SUT VIC AB 4-0 P-3 18XBRD (SUTURE) IMPLANT
SUT VIC AB 4-0 P3 18 (SUTURE)
SUT VICRYL 0 TIES 12 18 (SUTURE) ×3 IMPLANT
SUT VICRYL 2 0 18  UND BR (SUTURE)
SUT VICRYL 2 0 18 UND BR (SUTURE) IMPLANT
SUT VICRYL 4-0 PS2 18IN ABS (SUTURE) IMPLANT
SYR 20CC LL (SYRINGE) ×3 IMPLANT
SYR BULB IRRIGATION 50ML (SYRINGE) ×3 IMPLANT
SYR CONTROL 10ML LL (SYRINGE) ×3 IMPLANT
TOWEL OR 17X24 6PK STRL BLUE (TOWEL DISPOSABLE) ×6 IMPLANT
TRAY DSU PREP LF (CUSTOM PROCEDURE TRAY) ×3 IMPLANT
TUBE CONNECTING 12'X1/4 (SUCTIONS) ×1
TUBE CONNECTING 12X1/4 (SUCTIONS) ×2 IMPLANT
WATER STERILE IRR 500ML POUR (IV SOLUTION) IMPLANT
YANKAUER SUCT BULB TIP NO VENT (SUCTIONS) ×3 IMPLANT

## 2014-02-07 NOTE — Anesthesia Procedure Notes (Signed)
Procedure Name: LMA Insertion Date/Time: 02/07/2014 7:34 AM Performed by: Bethena Roys T Pre-anesthesia Checklist: Patient identified, Emergency Drugs available, Suction available and Patient being monitored Patient Re-evaluated:Patient Re-evaluated prior to inductionOxygen Delivery Method: Circle System Utilized Preoxygenation: Pre-oxygenation with 100% oxygen Intubation Type: IV induction Ventilation: Mask ventilation without difficulty LMA: LMA inserted LMA Size: 5.0 Number of attempts: 1 Airway Equipment and Method: bite block Placement Confirmation: positive ETCO2 Dental Injury: Teeth and Oropharynx as per pre-operative assessment

## 2014-02-07 NOTE — Discharge Instructions (Addendum)
Orchiectomy, Care After Refer to this sheet in the next few weeks. These instructions provide you with information on caring for yourself after your procedure. Your health care provider may also give you more specific instructions. Your treatment has been planned according to current medical practices, but problems sometimes occur. Call your health care provider if you have any problems or questions after your procedure. WHAT TO EXPECT AFTER THE PROCEDURE A sterile dressing will be applied to the incision site. You may have a scrotal support. This elevates the scrotum, thereby relieving pressure on the surgical site. In those cases where the scrotal support irritates the incision site, you may be better with the support removed. It is okay if the dressing comes off, especially at night. Air will help a scab to form, which will eliminate the need for dressings during the day. HOME CARE INSTRUCTIONS  Your sterile dressing may be changed once per day or as instructed by your health care provider. If the dressing sticks to your incision site, you may use warm, soapy water or hydrogen peroxide to dampen the bandage to loosen it from your skin so that it may be removed.  You may take showers the day after your procedure. Let the water pass gently over the surgery site. Do not rub the site. Pad the area gently or allow to air dry.  Avoid activities that may cause your incision to open.  Do not engage in sexual activity until the area is healed. Usually this will be in about 10 14 days.  Only take over-the-counter or prescription medicines for pain, discomfort, or fever as directed by your health care provider. SEEK MEDICAL CARE IF:  You experience increasing pain. SEEK IMMEDIATE MEDICAL CARE IF:  You have persistent dizziness or feel sick to your stomach (nausea).  You have difficulty staying awake or are unable to wake from sleeping.  You have difficulty breathing or a congested cough.  You have  a fever or shaking chills.  You have increasing pain or tenderness at the incision site.  You notice pus coming from the incision.  You notice a bad smell coming from the incision or dressing.  You cannot eat or drink or you develop nausea or vomiting.  You have constipation that is not helped by adjusting your diet or increasing fluid intake. Pain medications are a common cause of constipation. Your incision breaks open after the sutures have been  Post Anesthesia Home Care Instructions  Activity: Get plenty of rest for the remainder of the day. A responsible adult should stay with you for 24 hours following the procedure.  For the next 24 hours, DO NOT: -Drive a car -Paediatric nurse -Drink alcoholic beverages -Take any medication unless instructed by your physician -Make any legal decisions or sign important papers.  Meals: Start with liquid foods such as gelatin or soup. Progress to regular foods as tolerated. Avoid greasy, spicy, heavy foods. If nausea and/or vomiting occur, drink only clear liquids until the nausea and/or vomiting subsides. Call your physician if vomiting continues.  Special Instructions/Symptoms: Your throat may feel dry or sore from the anesthesia or the breathing tube placed in your throat during surgery. If this causes discomfort, gargle with warm salt water. The discomfort should disappear within 24 hours.  removed.  You experience pain, swelling, or redness in your genital or groin area. Document Released: 04/27/2013 Document Reviewed: 04/27/2013 [facility] Patient Information 2014 [name]

## 2014-02-07 NOTE — Brief Op Note (Signed)
02/07/2014  8:29 AM  PATIENT:  Jeremy Clay  33 y.o. male  PRE-OPERATIVE DIAGNOSIS:  right testicular mass  POST-OPERATIVE DIAGNOSIS:  right testicular mass  PROCEDURE:  Procedure(s): RIGHT RADICAL ORCHIECTOMY (Right)  SURGEON:  Surgeon(s) and Role:    * Malka So, MD - Primary  PHYSICIAN ASSISTANT:   ASSISTANTS: none   ANESTHESIA:   general  EBL:  Total I/O In: 100 [I.V.:100] Out: -   BLOOD ADMINISTERED:none  DRAINS: none   LOCAL MEDICATIONS USED:  LIDOCAINE  and Amount: 9 ml  SPECIMEN:  Source of Specimen:  right testicle  DISPOSITION OF SPECIMEN:  PATHOLOGY  COUNTS:  YES  TOURNIQUET:  * No tourniquets in log *  DICTATION: .Other Dictation: Dictation Number [id number]  PLAN OF CARE: Discharge to home after PACU  PATIENT DISPOSITION:  PACU - hemodynamically stable.   Delay start of Pharmacological VTE agent (>24hrs) due to surgical blood loss or risk of bleeding: not applicable

## 2014-02-07 NOTE — Transfer of Care (Signed)
Immediate Anesthesia Transfer of Care Note  Patient: Jeremy Clay  Procedure(s) Performed: Procedure(s): RIGHT RADICAL ORCHIECTOMY (Right)  Patient Location: PACU  Anesthesia Type:General  Level of Consciousness: awake, alert  and oriented  Airway & Oxygen Therapy: Patient Spontanous Breathing  Post-op Assessment: Report given to PACU RN and Post -op Vital signs reviewed and stable  Post vital signs: Reviewed and stable  Complications: No apparent anesthesia complications

## 2014-02-07 NOTE — Interval H&P Note (Signed)
History and Physical Interval Note:  His markers and CXR are all negative but he has a 2.8cm node in the retroperitoneum at the aortic bifurcation that is consistent with a met.  He has f/u with medical oncology on 6/17.  02/07/2014 7:23 AM  Gerda Diss Nell  has presented today for surgery, with the diagnosis of right testicular mass  The various methods of treatment have been discussed with the patient and family. After consideration of risks, benefits and other options for treatment, the patient has consented to  Procedure(s): RIGHT RADICAL ORCHIECTOMY (Right) as a surgical intervention .  The patient's history has been reviewed, patient examined, no change in status, stable for surgery.  I have reviewed the patient's chart and labs.  Questions were answered to the patient's satisfaction.     Malka So

## 2014-02-07 NOTE — Op Note (Signed)
NAME:  TERRIN, MEDDAUGH NO.:  [id number]  MEDICAL RECORD NO.:  [id number]  LOCATION:                                 FACILITY:  PHYSICIAN:  Marshall Cork. Jeffie Pollock, M.D.    DATE OF BIRTH:  01-04-1981  DATE OF PROCEDURE:  02/07/2014 DATE OF DISCHARGE:  02/07/2014                              OPERATIVE REPORT   PROCEDURE:  Right radical orchiectomy.  PREOPERATIVE DIAGNOSIS:  An 8.3-cm right testicular mass consistent with cancer.  POSTOPERATIVE DIAGNOSIS:  An 8.3-cm right testicular mass consistent with cancer.  SURGEON:  Marshall Cork. Jeffie Pollock, MD  ANESTHESIA:  General.  SPECIMEN:  Right testicle and cord.  DRAINS:  None.  BLOOD LOSS:  Minimal.  COMPLICATIONS:  None.  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  His tumor markers were negative as was his chest x-ray, but he has a 2.8-cm node at the aortic bifurcation consistent with metastasis.  It was felt that radical orchiectomy was indicated to establish diagnosis.  He will require subsequent therapy and has been referred to Oncology as well.  FINDINGS OF PROCEDURE:  He was given 2 g of Ancef.  He was taken to the operating room where general anesthetic was induced.  He was placed in a supine position.  His right inguinal area and scrotum were clipped.  He was prepped with Betadine solution and draped in usual sterile fashion.  An oblique right inguinal incision was made along the skin lines, is carried down through the subcutaneous tissue with the Bovie until the external oblique fascia was identified.  The fascia was then incised along its fibers over the cord.  A nerve was identified adherent to the cord, attempts were made to isolate this from the cord that was densely adherent, and I am not sure that it was completely freed without damage.  Once the nerve  had been freed from the cord, the cord was dissected out and doubly encircled with a 0.25-inch Penrose drain to provide vascular control.  The testicle with the large mass was then gradually dissected from the scrotum and brought up into the inguinal canal, it was quite difficult due to the size, but I was able to successfully express the mass into the inguinal canal without rupture of the mass or further extension of the incision.  Once the mass had been partially freed, the gubernacular attachments were taken down with blunt and Bovie dissection.  Once the testicle was freed from the scrotum, the cord was divided into two packets, clamped and divided using hemostats and scissors at the level of the inguinal ring.  Each of the two packets was then doubly ligated with 0 Vicryl ties which were left along for future identification if need be. At this point, hemostasis was achieved and the floor of the inguinal canal in the external oblique fascia was closed using a running 3-0 Vicryl suture.  The nerve was not readily identified at this point, and it would not surprise me if it had been damaged due to the size of  the testicular mass and difficulty of the dissection.  At this point, the scrotum was inspected and a few small bleeding areas were fulgurated.  Once adequate hemostasis was achieved, the incision was infiltrated with 9 mL of 0.25% marcaine without epinephrine and the subcutaneous tissue was closed using a 3-0 chromic suture.  The skin was closed using running 4-0 Monocryl intracuticular stitch.  The skin was then secured with Dermabond, after the wound was cleaned and dried.  A dressing of Kerlix and 4x4's, followed by scrotal support was applied. The patient's anesthetic was reversed.  He was moved to recovery room in stable condition.  There were no complications.     Marshall Cork. Jeffie Pollock, M.D.     JJW/MEDQ  D:  02/07/2014  T:  02/07/2014  Job:  [id number]

## 2014-02-07 NOTE — H&P (View-Only) (Signed)
UNABLE TO REACH PT.  LM ON CELL #[phone]. NPO AFTER MN. ARRIVE AT 0600. 

## 2014-02-08 ENCOUNTER — Encounter (HOSPITAL_BASED_OUTPATIENT_CLINIC_OR_DEPARTMENT_OTHER): Payer: Self-pay | Admitting: Urology

## 2014-02-08 NOTE — Anesthesia Postprocedure Evaluation (Signed)
Anesthesia Post Note  Patient: Jeremy Clay  Procedure(s) Performed: Procedure(s) (LRB): RIGHT RADICAL ORCHIECTOMY (Right)  Anesthesia type: General  Patient location: PACU  Post pain: Pain level controlled  Post assessment: Post-op Vital signs reviewed  Last Vitals: BP 133/90  Pulse 73  Temp(Src) 36.1 C (Oral)  Resp 16  Wt 176 lb (79.833 kg)  SpO2 100%  Post vital signs: Reviewed  Level of consciousness: sedated  Complications: No apparent anesthesia complications

## 2014-02-17 ENCOUNTER — Other Ambulatory Visit: Payer: Self-pay | Admitting: Oncology

## 2014-02-21 ENCOUNTER — Telehealth: Payer: Self-pay | Admitting: Medical Oncology

## 2014-02-21 NOTE — Telephone Encounter (Signed)
LVMOM with patient reminding him of tomorrow's appt. Requested pt return call to office to let us know he received mssg.

## 2014-02-22 ENCOUNTER — Telehealth: Payer: Self-pay | Admitting: *Deleted

## 2014-02-22 ENCOUNTER — Telehealth: Payer: Self-pay | Admitting: Oncology

## 2014-02-22 ENCOUNTER — Encounter: Payer: Self-pay | Admitting: Oncology

## 2014-02-22 ENCOUNTER — Ambulatory Visit: Payer: Self-pay

## 2014-02-22 ENCOUNTER — Ambulatory Visit (HOSPITAL_BASED_OUTPATIENT_CLINIC_OR_DEPARTMENT_OTHER): Payer: Self-pay | Admitting: Oncology

## 2014-02-22 ENCOUNTER — Other Ambulatory Visit: Payer: Self-pay

## 2014-02-22 VITALS — BP 123/77 | HR 87 | Temp 98.1°F | Resp 18 | Ht 70.0 in | Wt 182.6 lb

## 2014-02-22 DIAGNOSIS — C772 Secondary and unspecified malignant neoplasm of intra-abdominal lymph nodes: Secondary | ICD-10-CM

## 2014-02-22 DIAGNOSIS — F17200 Nicotine dependence, unspecified, uncomplicated: Secondary | ICD-10-CM

## 2014-02-22 DIAGNOSIS — C6291 Malignant neoplasm of right testis, unspecified whether descended or undescended: Secondary | ICD-10-CM

## 2014-02-22 DIAGNOSIS — M549 Dorsalgia, unspecified: Secondary | ICD-10-CM

## 2014-02-22 DIAGNOSIS — G8929 Other chronic pain: Secondary | ICD-10-CM

## 2014-02-22 DIAGNOSIS — C6290 Malignant neoplasm of unspecified testis, unspecified whether descended or undescended: Secondary | ICD-10-CM

## 2014-02-22 MED ORDER — PROCHLORPERAZINE MALEATE 10 MG PO TABS: 10.0000 mg | ORAL_TABLET | Freq: Four times a day (QID) | ORAL | Status: DC | PRN

## 2014-02-22 NOTE — Progress Notes (Signed)
Per MD, referral to [facility], social worker. LVMOM with Lauren to call pt.

## 2014-02-22 NOTE — Progress Notes (Signed)
Please see consult note.  

## 2014-02-22 NOTE — Telephone Encounter (Signed)
Gave pt appt for chemo class,PFT @ [facility], MD and lab, Sharyn Lull aware of chemo orders, instructed pt to get appt clanedar next , the day of chemo class

## 2014-02-22 NOTE — Telephone Encounter (Signed)
gave pt appt for lab,md and chemo for July 2015

## 2014-02-22 NOTE — Telephone Encounter (Signed)
Per staff phone call and POF I have schedueld appts. Scheduler advised of appts.  JMW  

## 2014-02-22 NOTE — Consult Note (Signed)
Reason for Referral: Testicular cancer.   HPI: 33 year old gentleman currently of [place] where he lived the majority of his life. He is a gentleman with a history of back injury but really no other comorbidity conditions. He also has a long smoking history which she is trying to quit. He started noticing a right testicular pain about a year ago and did not seek any medical attention until about recently where he noticed a large painful right testicular mass. He was evaluated in the emergency department and was found to have a 9.1 x 6.1 x 8.4 cm mass in the right testicle by an ultrasound that was done on 01/25/2014. On 02/02/2014 he had a CT scan of the abdomen and pelvis which showed the right testicular mass as mentioned also retroperitoneal mass at the level of the uric bifurcation measuring 2.8 x 3.6 x 3.8 cm consistent with nodal metastasis. No other metastatic lesions noted. Patient was evaluated by Dr. Jeffie Pollock and his laboratory testing did not reveal any abnormalities in his tumor marker. He had a normal alpha-fetoprotein and beta hCG. He underwent a radical right orchiectomy on 02/07/2014 which she tolerated very well. 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 The pathological staging was T1 NX. Patient referred to me for further evaluation.  Clinically, he is asymptomatic at this point. He reports some groin discomfort that has improved but does not report any abdominal pain or distention. Does not report any genitourinary complaints. Did not report any constitutional symptoms. He does not report any fevers or chills or sweats. Does not report any weight loss or appetite changes. Does not report any headaches or blurry vision or alteration in mental status. Does not report any syncope or seizures. He does not report any chest pain shortness of breath or difficulty breathing. Report  any cough or hemoptysis. Does not report any cough or wheezing. Does not report any palpitation, orthopnea or leg edema. Does not report any nausea, vomiting, diarrhea, constipation, hematochezia or melena. Does not report any frequency, urgency, hesitancy or hematuria. Does report back pain that have been chronic in nature. He does not report any arthralgias or myalgias. He does not report any skin rashes or lesions. Rest of his review of systems unremarkable. He continue to live with his father and currently unemployed.   Past Medical History  Diagnosis Date  . Herniated disc   . Testicular mass     right  . 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   :  Past Surgical History  Procedure Laterality Date  . Orchiectomy Right 02/07/2014    Procedure: RIGHT RADICAL ORCHIECTOMY;  Surgeon: Malka So, MD;  Location[facility];  Service: Urology;  Laterality: Right;  :   Current Outpatient Prescriptions  Medication Sig Dispense Refill  . Aspirin-Acetaminophen (GOODYS BODY PAIN PO) Take 1 packet by mouth daily as needed (pain).      [name] HYDROcodone-acetaminophen (NORCO) 5-325 MG per tablet Take 1 tablet by mouth every 6 (six) hours as needed for moderate pain.  30 tablet  0  . ibuprofen (ADVIL,MOTRIN) 200 MG tablet Take 600 mg by mouth every 6 (six) hours as needed for moderate pain.      . naproxen sodium (ANAPROX) 220 MG tablet Take 220 mg by mouth 2 (two) times daily with  a meal.      . prochlorperazine (COMPAZINE) 10 MG tablet Take 1 tablet (10 mg total) by mouth every 6 (six) hours as needed for nausea or vomiting.  30 tablet  0   No current facility-administered medications for this visit.      No Known Allergies:  Family History  Problem Relation Age of Onset  . Diabetes Other   . Heart failure Other   :  History   Social History  . Marital Status: Single    Spouse Name: N/A    Number of  Children: N/A  . Years of Education: N/A   Occupational History  . Not on file.   Social History Main Topics  . Smoking status: Current Every Day Smoker -- 0.50 packs/day for 13 years    Types: Cigarettes  . Smokeless tobacco: Never Used  . Alcohol Use: No  . Drug Use: Yes     Comment: Has not used Cocaine in 6 months   . Sexual Activity: Not on file   Other Topics Concern  . Not on file   Social History Narrative  . No narrative on file  :  Pertinent items are noted in HPI.  Exam: ECOG 0 Blood pressure 123/77, pulse 87, temperature 98.1 F (36.7 C), temperature source Oral, resp. rate 18, height 5\' 10"  (1.778 m), weight 182 lb 9.6 oz (82.827 kg). General appearance: alert and cooperative Head: Normocephalic, without obvious abnormality Throat: lips, mucosa, and tongue normal; teeth and gums normal Neck: no adenopathy and no JVD Back: symmetric, no curvature. ROM normal. No CVA tenderness. Resp: clear to auscultation bilaterally Chest wall: no tenderness Cardio: regular rate and rhythm, S1, S2 normal, no murmur, click, rub or gallop GI: soft, non-tender; bowel sounds normal; no masses,  no organomegaly Extremities: extremities normal, atraumatic, no cyanosis or edema Pulses: 2+ and symmetric Skin: Skin color, texture, turgor normal. No rashes or lesions      Dg Chest 2 View  01/31/2014   CLINICAL DATA:  Testicular mass  EXAM: CHEST  2 VIEW  COMPARISON:  December 21, 2003  FINDINGS: Lungs are clear. Heart size and pulmonary vascularity are normal. No adenopathy. No bone lesions.  IMPRESSION: No abnormality noted.   Electronically Signed   By: Lowella Grip M.D.   On: 01/31/2014 08:46   US Scrotum  01/25/2014   CLINICAL DATA:  RIGHT testicular enlargement for 9 months, nonpainful, hard smooth feel on palpation, groin swelling  EXAM: SCROTAL ULTRASOUND  DOPPLER ULTRASOUND OF THE TESTICLES  TECHNIQUE: Complete ultrasound examination of the testicles, epididymis, and other  scrotal structures was performed. Color and spectral Doppler ultrasound were also utilized to evaluate blood flow to the testicles.  COMPARISON:  None.  FINDINGS: Right testicle  Measurements: Enlarged, 9.1 x 6.1 x 8.4 cm. Abnormal appearance, demonstrating extensive microlithiasis, cystic areas, solid components, and areas of mixed isoechogenicity and hypoechogenicity. Largest cystic component 2.3 x 1.3 x 2.2 cm. Largest discrete solid component measures 1.7 x 1.9 x 1.6 cm. Blood flow present within the enlarged RIGHT testis on color Doppler imaging.  Left testicle  Measurements: 4.7 x 2.2 x 3.8 cm. Diffuse microlithiasis. No discrete mass. Internal blood flow present on color Doppler imaging.  Right epididymis:  Not visualized  Left epididymis:  Normal in size and appearance.  Hydrocele:  Small LEFT hydrocele.  No RIGHT hydrocele.  Varicocele:  No definite varicocele identified  Pulsed Doppler interrogation of both testes demonstrates low resistance arterial and venous waveforms in the  LEFT testis. Low resistance arterial and venous waveforms are present within the enlarged abnormal appearing RIGHT testis as well.  IMPRESSION: Normal LEFT testis.  Small LEFT hydrocele.  Markedly enlarged heterogeneous RIGHT testis 9.1 x 6.1 x 8.4 cm containing solid areas, cystic areas, microcalcifications, and nonspecific heterogeneous areas of mixed echogenicity.  Lesion is highly suspicious for testicular neoplasm.  Appearance is atypical for advanced tubular ectasia of the rete testis.  Granulomatous processes could cause a similar appearance but considered much less likely.  Urologic evaluation recommended.  Findings called to Dr.  Rolland Porter on 01/25/2014 at 1635 hr.   Electronically Signed   By: Lavonia Dana M.D.   On: 01/25/2014 16:43   Ct Abdomen Pelvis W Contrast  02/02/2014   CLINICAL DATA:  Testicular mass for 1 year.  Staging.  EXAM: CT ABDOMEN AND PELVIS WITH CONTRAST  TECHNIQUE: Multidetector CT imaging of the  abdomen and pelvis was performed using the standard protocol following bolus administration of intravenous contrast.  CONTRAST:  139mL OMNIPAQUE IOHEXOL 300 MG/ML  SOLN  COMPARISON:  Scrotal ultrasound 01/25/2014.  FINDINGS: The lung bases are clear. There is no pleural or pericardial effusion.  As demonstrated on recent ultrasound, there is a large complex multi-cystic mass involving the right testis. This mass measures 7.6 x 7.7 x 8.3 cm. There is a similar appearing multi-septated retroperitoneal mass on the right at the level of the aortic bifurcation, measuring 2.8 x 3.6 x 3.8 cm. This is consistent with a nodal metastasis. No other enlarged retroperitoneal or pelvic lymph nodes are identified.  The liver, gallbladder, biliary system and pancreas appear normal. The spleen, adrenal glands and kidneys appear normal. There is no hydronephrosis.  There is a small gastric fundal diverticulum. The stomach, small bowel, appendix and colon demonstrate no other significant findings. The bladder, prostate gland and seminal vesicles appear normal.  There are no worrisome osseous findings.  IMPRESSION: 1. 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 2. No other evidence of metastatic disease. 3. No hydronephrosis.   Electronically Signed   By: Camie Patience M.D.   On: 02/02/2014 09:00     Assessment and Plan:   33 year old gentleman with the following issues:  1. Testicular cancer diagnosed in June of 2015. He presented with a large right testicular mass and underwent a radical orchiectomy which showed a mixture muscle tremor with predominant teratoma component. He does have embryonal as well as you sac tumor component. CT scan showed a large periaortic lymphadenopathy consisting with stage IIB disease. Options of treatments were discussed with the patient including definitive radiation therapy, systemic chemotherapy as well as a retroperitoneal lymph node  dissection. It is very likely that he might require 2 modalities of treatment including systemic chemotherapy and possible surgery. I believe that given the size of the retroperitoneal lymph node involvement that radiation therapy might not be the best option.  The logistics of administration of chemotherapy was discussed today extensively. For this particular stage of mixed germ cell tumor, 3 cycles of BEP chemotherapy is the standard of care. The logistics as well as the complications associated with this chemotherapy discussed today extensively. Complications that includes nausea, vomiting, myelosuppression, neutropenia, neutropenic sepsis, neuropathy, renal dysfunction and rarely VTE and death. Complications associated with bleomycin include pulmonary toxicity and rarely that is severe that could constitute a life-threatening condition. I also discussed with him today toxicity with chemotherapy including secondary leukemia associated with etoposide as well as infertility.  I  explained to him the role of retroperitoneal lymph node dissection probably will be needed upon completing chemotherapy if he has any teratoma residual disease.  He is agreeable to proceed I anticipate to start in about 3 weeks allowing time for him to heal from his operation likely the the week of July 6th after chemotherapy education class.   2. Fertility preservation: This was discussed with the patient extensively and he does not desire any sperm banking at this time. He understands the potential complications associated with chemotherapy.  3. IV access: We will use his peripheral veins on hold off on Port-A-Cath insertion after discussion with the patient today.  4. Anti-emetics. Prescription for Compazine was given to the patient with instructions how to use it.  5. Pulmonary toxicity prophylaxis: We will obtain baseline pulmonary function tests and DLCO.  6. Psychosocial support: He currently does not have anxiety  issues but certainly has a lot of financial needs and lack of insurance. A referral to social worker today to assist him prescription drug coverage among other issues.  7. Followup will be with chemotherapy to assess any complications.

## 2014-02-22 NOTE — Progress Notes (Signed)
Checked in patient with no insurance today. He knows of self pay. He has applied for medicaid.

## 2014-02-23 ENCOUNTER — Encounter: Payer: Self-pay | Admitting: Oncology

## 2014-02-23 NOTE — Progress Notes (Signed)
Etoposide, cisplatin, blyeomycin are not replaceable drugs.

## 2014-02-24 ENCOUNTER — Ambulatory Visit: Payer: Self-pay | Admitting: Urology

## 2014-02-28 ENCOUNTER — Encounter: Payer: Self-pay | Admitting: *Deleted

## 2014-02-28 ENCOUNTER — Other Ambulatory Visit: Payer: Self-pay

## 2014-03-07 ENCOUNTER — Ambulatory Visit (HOSPITAL_COMMUNITY)
Admission: RE | Admit: 2014-03-07 | Discharge: 2014-03-07 | Disposition: A | Discharge status: Home / Self Care | Payer: Self-pay | Source: Ambulatory Visit | Attending: Oncology | Admitting: Oncology

## 2014-03-07 DIAGNOSIS — C6290 Malignant neoplasm of unspecified testis, unspecified whether descended or undescended: Secondary | ICD-10-CM | POA: Insufficient documentation

## 2014-03-07 DIAGNOSIS — J988 Other specified respiratory disorders: Secondary | ICD-10-CM | POA: Insufficient documentation

## 2014-03-07 DIAGNOSIS — C6291 Malignant neoplasm of right testis, unspecified whether descended or undescended: Secondary | ICD-10-CM

## 2014-03-07 DIAGNOSIS — R0609 Other forms of dyspnea: Secondary | ICD-10-CM | POA: Insufficient documentation

## 2014-03-07 DIAGNOSIS — R0989 Other specified symptoms and signs involving the circulatory and respiratory systems: Principal | ICD-10-CM | POA: Insufficient documentation

## 2014-03-07 MED ORDER — ALBUTEROL SULFATE (2.5 MG/3ML) 0.083% IN NEBU
2.5000 mg | INHALATION_SOLUTION | Freq: Once | RESPIRATORY_TRACT | Status: AC
  Administered 2014-03-07: 2.5 mg via RESPIRATORY_TRACT

## 2014-03-12 LAB — PULMONARY FUNCTION TEST
DL/VA % PRED: 99 %
DL/VA: 4.64 ml/min/mmHg/L
DLCO UNC % PRED: 88 %
DLCO UNC: 28.46 ml/min/mmHg
DLCO cor % pred: 88 %
DLCO cor: 28.46 ml/min/mmHg
FEF 25-75 POST: 3.29 L/s
FEF 25-75 Pre: 2.34 L/sec
FEF2575-%Change-Post: 40 %
FEF2575-%PRED-POST: 76 %
FEF2575-%Pred-Pre: 54 %
FEV1-%Change-Post: 11 %
FEV1-%PRED-PRE: 73 %
FEV1-%Pred-Post: 81 %
FEV1-POST: 3.59 L
FEV1-Pre: 3.22 L
FEV1FVC-%CHANGE-POST: 4 %
FEV1FVC-%Pred-Pre: 86 %
FEV6-%CHANGE-POST: 6 %
FEV6-%PRED-POST: 91 %
FEV6-%PRED-PRE: 86 %
FEV6-Post: 4.9 L
FEV6-Pre: 4.61 L
FEV6FVC-%PRED-POST: 102 %
FEV6FVC-%Pred-Pre: 102 %
FVC-%CHANGE-POST: 6 %
FVC-%PRED-POST: 90 %
FVC-%Pred-Pre: 84 %
FVC-POST: 4.91 L
FVC-Pre: 4.61 L
PRE FEV1/FVC RATIO: 70 %
Post FEV1/FVC ratio: 73 %
Post FEV6/FVC ratio: 100 %
Pre FEV6/FVC Ratio: 100 %
RV % PRED: 105 %
RV: 1.78 L
TLC % pred: 87 %
TLC: 6.02 L

## 2014-03-13 ENCOUNTER — Other Ambulatory Visit (HOSPITAL_BASED_OUTPATIENT_CLINIC_OR_DEPARTMENT_OTHER): Payer: Self-pay

## 2014-03-13 ENCOUNTER — Ambulatory Visit (HOSPITAL_BASED_OUTPATIENT_CLINIC_OR_DEPARTMENT_OTHER): Payer: Self-pay

## 2014-03-13 VITALS — BP 115/73 | HR 73 | Temp 98.0°F | Resp 16

## 2014-03-13 DIAGNOSIS — C772 Secondary and unspecified malignant neoplasm of intra-abdominal lymph nodes: Secondary | ICD-10-CM

## 2014-03-13 DIAGNOSIS — C6291 Malignant neoplasm of right testis, unspecified whether descended or undescended: Secondary | ICD-10-CM

## 2014-03-13 DIAGNOSIS — C6290 Malignant neoplasm of unspecified testis, unspecified whether descended or undescended: Secondary | ICD-10-CM

## 2014-03-13 DIAGNOSIS — Z5111 Encounter for antineoplastic chemotherapy: Secondary | ICD-10-CM

## 2014-03-13 LAB — CBC WITH DIFFERENTIAL/PLATELET
BASO%: 0.9 % (ref 0.0–2.0)
Basophils Absolute: 0 10*3/uL (ref 0.0–0.1)
EOS%: 2.5 % (ref 0.0–7.0)
Eosinophils Absolute: 0.1 10*3/uL (ref 0.0–0.5)
HCT: 39.3 % (ref 38.4–49.9)
HEMOGLOBIN: 12.9 g/dL — AB (ref 13.0–17.1)
LYMPH%: 40.8 % (ref 14.0–49.0)
MCH: 27.1 pg — AB (ref 27.2–33.4)
MCHC: 32.8 g/dL (ref 32.0–36.0)
MCV: 82.5 fL (ref 79.3–98.0)
MONO#: 0.5 10*3/uL (ref 0.1–0.9)
MONO%: 10.2 % (ref 0.0–14.0)
NEUT#: 2.4 10*3/uL (ref 1.5–6.5)
NEUT%: 45.6 % (ref 39.0–75.0)
Platelets: 172 10*3/uL (ref 140–400)
RBC: 4.77 10*6/uL (ref 4.20–5.82)
RDW: 13.8 % (ref 11.0–14.6)
WBC: 5.3 10*3/uL (ref 4.0–10.3)
lymph#: 2.1 10*3/uL (ref 0.9–3.3)

## 2014-03-13 LAB — COMPREHENSIVE METABOLIC PANEL (CC13)
ALBUMIN: 3.8 g/dL (ref 3.5–5.0)
ALK PHOS: 77 U/L (ref 40–150)
ALT: 22 U/L (ref 0–55)
AST: 16 U/L (ref 5–34)
Anion Gap: 7 mEq/L (ref 3–11)
BUN: 15 mg/dL (ref 7.0–26.0)
CALCIUM: 9.6 mg/dL (ref 8.4–10.4)
CO2: 27 mEq/L (ref 22–29)
Chloride: 103 mEq/L (ref 98–109)
Creatinine: 0.8 mg/dL (ref 0.7–1.3)
Glucose: 101 mg/dl (ref 70–140)
POTASSIUM: 3.8 meq/L (ref 3.5–5.1)
Sodium: 138 mEq/L (ref 136–145)
Total Bilirubin: 0.25 mg/dL (ref 0.20–1.20)
Total Protein: 8.1 g/dL (ref 6.4–8.3)

## 2014-03-13 MED ORDER — SODIUM CHLORIDE 0.9 % IV SOLN
100.0000 mg/m2 | Freq: Once | INTRAVENOUS | Status: AC
  Administered 2014-03-13: 200 mg via INTRAVENOUS
  Filled 2014-03-13: qty 10

## 2014-03-13 MED ORDER — DEXAMETHASONE SODIUM PHOSPHATE 20 MG/5ML IJ SOLN
12.0000 mg | Freq: Once | INTRAMUSCULAR | Status: AC
  Administered 2014-03-13: 12 mg via INTRAVENOUS

## 2014-03-13 MED ORDER — PALONOSETRON HCL INJECTION 0.25 MG/5ML
0.2500 mg | Freq: Once | INTRAVENOUS | Status: AC
  Administered 2014-03-13: 0.25 mg via INTRAVENOUS

## 2014-03-13 MED ORDER — PALONOSETRON HCL INJECTION 0.25 MG/5ML
INTRAVENOUS | Status: AC
  Filled 2014-03-13: qty 5

## 2014-03-13 MED ORDER — SODIUM CHLORIDE 0.9 % IV SOLN
150.0000 mg | Freq: Once | INTRAVENOUS | Status: AC
  Administered 2014-03-13: 150 mg via INTRAVENOUS
  Filled 2014-03-13: qty 5

## 2014-03-13 MED ORDER — SODIUM CHLORIDE 0.9 % IV SOLN
Freq: Once | INTRAVENOUS | Status: AC
  Administered 2014-03-13: 10:00:00 via INTRAVENOUS

## 2014-03-13 MED ORDER — POTASSIUM CHLORIDE 2 MEQ/ML IV SOLN
Freq: Once | INTRAVENOUS | Status: AC
  Administered 2014-03-13: 11:00:00 via INTRAVENOUS
  Filled 2014-03-13: qty 10

## 2014-03-13 MED ORDER — SODIUM CHLORIDE 0.9 % IV SOLN
20.0000 mg/m2 | Freq: Once | INTRAVENOUS | Status: AC
  Administered 2014-03-13: 40 mg via INTRAVENOUS
  Filled 2014-03-13: qty 40

## 2014-03-13 MED ORDER — DEXAMETHASONE SODIUM PHOSPHATE 20 MG/5ML IJ SOLN
INTRAMUSCULAR | Status: AC
  Filled 2014-03-13: qty 5

## 2014-03-13 NOTE — Patient Instructions (Signed)
[facility] Discharge Instructions for Patients Receiving Chemotherapy  Today you received the following chemotherapy agents:  Cisplatin & VP-16  To help prevent nausea and vomiting after your treatment, we encourage you to take your nausea medication   If you develop nausea and vomiting that is not controlled by your nausea medication, call the clinic.   BELOW ARE SYMPTOMS THAT SHOULD BE REPORTED IMMEDIATELY:  *FEVER GREATER THAN 100.5 F  *CHILLS WITH OR WITHOUT FEVER  NAUSEA AND VOMITING THAT IS NOT CONTROLLED WITH YOUR NAUSEA MEDICATION  *UNUSUAL SHORTNESS OF BREATH  *UNUSUAL BRUISING OR BLEEDING  TENDERNESS IN MOUTH AND THROAT WITH OR WITHOUT PRESENCE OF ULCERS  *URINARY PROBLEMS  *BOWEL PROBLEMS  UNUSUAL RASH Items with * indicate a potential emergency and should be followed up as soon as possible.  Feel free to call the clinic you have any questions or concerns. The clinic phone number is [phone].   Etoposide, VP-16 injection What is this medicine? ETOPOSIDE, VP-16 (e toe POE side) is a chemotherapy drug. It is used to treat testicular cancer, lung cancer, and other cancers. This medicine may be used for other purposes; ask your health care provider or pharmacist if you have questions. COMMON BRAND NAME(S): Etopophos, Toposar, VePesid What should I tell my health care provider before I take this medicine? They need to know if you have any of these conditions: -infection -kidney disease -low blood counts, like low white cell, platelet, or red cell counts -an unusual or allergic reaction to etoposide, other chemotherapeutic agents, other medicines, foods, dyes, or preservatives -pregnant or trying to get pregnant -breast-feeding How should I use this medicine? This medicine is for infusion into a vein. It is administered in a hospital or clinic by a specially trained health care professional. Talk to your pediatrician regarding the use of this  medicine in children. Special care may be needed. Overdosage: If you think you have taken too much of this medicine contact a poison control center or emergency room at once. NOTE: This medicine is only for you. Do not share this medicine with others. What if I miss a dose? It is important not to miss your dose. Call your doctor or health care professional if you are unable to keep an appointment. What may interact with this medicine? -cyclosporine -medicines to increase blood counts like filgrastim, pegfilgrastim, sargramostim -vaccines This list may not describe all possible interactions. Give your health care provider a list of all the medicines, herbs, non-prescription drugs, or dietary supplements you use. Also tell them if you smoke, drink alcohol, or use illegal drugs. Some items may interact with your medicine. What should I watch for while using this medicine? Visit your doctor for checks on your progress. This drug may make you feel generally unwell. This is not uncommon, as chemotherapy can affect healthy cells as well as cancer cells. Report any side effects. Continue your course of treatment even though you feel ill unless your doctor tells you to stop. In some cases, you may be given additional medicines to help with side effects. Follow all directions for their use. Call your doctor or health care professional for advice if you get a fever, chills or sore throat, or other symptoms of a cold or flu. Do not treat yourself. This drug decreases your body's ability to fight infections. Try to avoid being around people who are sick. This medicine may increase your risk to bruise or bleed. Call your doctor or health care professional if  you notice any unusual bleeding. Be careful brushing and flossing your teeth or using a toothpick because you may get an infection or bleed more easily. If you have any dental work done, tell your dentist you are receiving this medicine. Avoid taking products  that contain aspirin, acetaminophen, ibuprofen, naproxen, or ketoprofen unless instructed by your doctor. These medicines may hide a fever. Do not become pregnant while taking this medicine. Women should inform their doctor if they wish to become pregnant or think they might be pregnant. There is a potential for serious side effects to an unborn child. Talk to your health care professional or pharmacist for more information. Do not breast-feed an infant while taking this medicine. What side effects may I notice from receiving this medicine? Side effects that you should report to your doctor or health care professional as soon as possible: -allergic reactions like skin rash, itching or hives, swelling of the face, lips, or tongue -low blood counts - this medicine may decrease the number of white blood cells, red blood cells and platelets. You may be at increased risk for infections and bleeding. -signs of infection - fever or chills, cough, sore throat, pain or difficulty passing urine -signs of decreased platelets or bleeding - bruising, pinpoint red spots on the skin, black, tarry stools, blood in the urine -signs of decreased red blood cells - unusually weak or tired, fainting spells, lightheadedness -breathing problems -changes in vision -mouth or throat sores or ulcers -pain, redness, swelling or irritation at the injection site -pain, tingling, numbness in the hands or feet -redness, blistering, peeling or loosening of the skin, including inside the mouth -seizures -vomiting Side effects that usually do not require medical attention (report to your doctor or health care professional if they continue or are bothersome): -diarrhea -hair loss -loss of appetite -nausea -stomach pain This list may not describe all possible side effects. Call your doctor for medical advice about side effects. You may report side effects to FDA at 1-800-FDA-1088. Where should I keep my medicine? This drug is  given in a hospital or clinic and will not be stored at home. NOTE: This sheet is a summary. It may not cover all possible information. If you have questions about this medicine, talk to your doctor, pharmacist, or health care provider.  2015, Elsevier/Gold Standard. (2007-12-27 17:24:12) Cisplatin injection What is this medicine? CISPLATIN (SIS pla tin) is a chemotherapy drug. It targets fast dividing cells, like cancer cells, and causes these cells to die. This medicine is used to treat many types of cancer like bladder, ovarian, and testicular cancers. This medicine may be used for other purposes; ask your health care provider or pharmacist if you have questions. COMMON BRAND NAME(S): Platinol, Platinol -AQ What should I tell my health care provider before I take this medicine? They need to know if you have any of these conditions: -blood disorders -hearing problems -kidney disease -recent or ongoing radiation therapy -an unusual or allergic reaction to cisplatin, carboplatin, other chemotherapy, other medicines, foods, dyes, or preservatives -pregnant or trying to get pregnant -breast-feeding How should I use this medicine? This drug is given as an infusion into a vein. It is administered in a hospital or clinic by a specially trained health care professional. Talk to your pediatrician regarding the use of this medicine in children. Special care may be needed. Overdosage: If you think you have taken too much of this medicine contact a poison control center or emergency room at once. NOTE: This  medicine is only for you. Do not share this medicine with others. What if I miss a dose? It is important not to miss a dose. Call your doctor or health care professional if you are unable to keep an appointment. What may interact with this medicine? -dofetilide -foscarnet -medicines for seizures -medicines to increase blood counts like filgrastim, pegfilgrastim,  sargramostim -probenecid -pyridoxine used with altretamine -rituximab -some antibiotics like amikacin, gentamicin, neomycin, polymyxin B, streptomycin, tobramycin -sulfinpyrazone -vaccines -zalcitabine Talk to your doctor or health care professional before taking any of these medicines: -acetaminophen -aspirin -ibuprofen -ketoprofen -naproxen This list may not describe all possible interactions. Give your health care provider a list of all the medicines, herbs, non-prescription drugs, or dietary supplements you use. Also tell them if you smoke, drink alcohol, or use illegal drugs. Some items may interact with your medicine. What should I watch for while using this medicine? Your condition will be monitored carefully while you are receiving this medicine. You will need important blood work done while you are taking this medicine. This drug may make you feel generally unwell. This is not uncommon, as chemotherapy can affect healthy cells as well as cancer cells. Report any side effects. Continue your course of treatment even though you feel ill unless your doctor tells you to stop. In some cases, you may be given additional medicines to help with side effects. Follow all directions for their use. Call your doctor or health care professional for advice if you get a fever, chills or sore throat, or other symptoms of a cold or flu. Do not treat yourself. This drug decreases your body's ability to fight infections. Try to avoid being around people who are sick. This medicine may increase your risk to bruise or bleed. Call your doctor or health care professional if you notice any unusual bleeding. Be careful brushing and flossing your teeth or using a toothpick because you may get an infection or bleed more easily. If you have any dental work done, tell your dentist you are receiving this medicine. Avoid taking products that contain aspirin, acetaminophen, ibuprofen, naproxen, or ketoprofen unless  instructed by your doctor. These medicines may hide a fever. Do not become pregnant while taking this medicine. Women should inform their doctor if they wish to become pregnant or think they might be pregnant. There is a potential for serious side effects to an unborn child. Talk to your health care professional or pharmacist for more information. Do not breast-feed an infant while taking this medicine. Drink fluids as directed while you are taking this medicine. This will help protect your kidneys. Call your doctor or health care professional if you get diarrhea. Do not treat yourself. What side effects may I notice from receiving this medicine? Side effects that you should report to your doctor or health care professional as soon as possible: -allergic reactions like skin rash, itching or hives, swelling of the face, lips, or tongue -signs of infection - fever or chills, cough, sore throat, pain or difficulty passing urine -signs of decreased platelets or bleeding - bruising, pinpoint red spots on the skin, black, tarry stools, nosebleeds -signs of decreased red blood cells - unusually weak or tired, fainting spells, lightheadedness -breathing problems -changes in hearing -gout pain -low blood counts - This drug may decrease the number of white blood cells, red blood cells and platelets. You may be at increased risk for infections and bleeding. -nausea and vomiting -pain, swelling, redness or irritation at the injection site -pain,  tingling, numbness in the hands or feet -problems with balance, movement -trouble passing urine or change in the amount of urine Side effects that usually do not require medical attention (report to your doctor or health care professional if they continue or are bothersome): -changes in vision -loss of appetite -metallic taste in the mouth or changes in taste This list may not describe all possible side effects. Call your doctor for medical advice about side  effects. You may report side effects to FDA at 1-800-FDA-1088. Where should I keep my medicine? This drug is given in a hospital or clinic and will not be stored at home. NOTE: This sheet is a summary. It may not cover all possible information. If you have questions about this medicine, talk to your doctor, pharmacist, or health care provider.  2015, Elsevier/Gold Standard. (2007-11-30 14:40:54)

## 2014-03-14 ENCOUNTER — Ambulatory Visit (HOSPITAL_BASED_OUTPATIENT_CLINIC_OR_DEPARTMENT_OTHER): Payer: Self-pay

## 2014-03-14 VITALS — BP 118/64 | HR 74 | Temp 97.9°F | Resp 16

## 2014-03-14 DIAGNOSIS — C772 Secondary and unspecified malignant neoplasm of intra-abdominal lymph nodes: Secondary | ICD-10-CM

## 2014-03-14 DIAGNOSIS — Z5111 Encounter for antineoplastic chemotherapy: Secondary | ICD-10-CM

## 2014-03-14 DIAGNOSIS — C6291 Malignant neoplasm of right testis, unspecified whether descended or undescended: Secondary | ICD-10-CM

## 2014-03-14 DIAGNOSIS — C6290 Malignant neoplasm of unspecified testis, unspecified whether descended or undescended: Secondary | ICD-10-CM

## 2014-03-14 MED ORDER — DEXAMETHASONE SODIUM PHOSPHATE 20 MG/5ML IJ SOLN
INTRAMUSCULAR | Status: AC
  Filled 2014-03-14: qty 5

## 2014-03-14 MED ORDER — DEXAMETHASONE SODIUM PHOSPHATE 20 MG/5ML IJ SOLN
20.0000 mg | Freq: Once | INTRAMUSCULAR | Status: AC
  Administered 2014-03-14: 20 mg via INTRAVENOUS

## 2014-03-14 MED ORDER — SODIUM CHLORIDE 0.9 % IV SOLN
20.0000 mg/m2 | Freq: Once | INTRAVENOUS | Status: AC
  Administered 2014-03-14: 40 mg via INTRAVENOUS
  Filled 2014-03-14: qty 40

## 2014-03-14 MED ORDER — BLEOMYCIN SULFATE CHEMO INJECTION 30 UNIT
30.0000 [IU] | Freq: Once | INTRAMUSCULAR | Status: AC
  Administered 2014-03-14: 30 [IU] via INTRAVENOUS
  Filled 2014-03-14: qty 10

## 2014-03-14 MED ORDER — POTASSIUM CHLORIDE 2 MEQ/ML IV SOLN
Freq: Once | INTRAVENOUS | Status: AC
  Administered 2014-03-14: 10:00:00 via INTRAVENOUS
  Filled 2014-03-14: qty 10

## 2014-03-14 MED ORDER — SODIUM CHLORIDE 0.9 % IV SOLN
100.0000 mg/m2 | Freq: Once | INTRAVENOUS | Status: AC
  Administered 2014-03-14: 200 mg via INTRAVENOUS
  Filled 2014-03-14: qty 10

## 2014-03-14 MED ORDER — SODIUM CHLORIDE 0.9 % IV SOLN
Freq: Once | INTRAVENOUS | Status: AC
  Administered 2014-03-14: 11:00:00 via INTRAVENOUS

## 2014-03-14 NOTE — Patient Instructions (Addendum)
[facility] Discharge Instructions for Patients Receiving Chemotherapy  Today you received the following chemotherapy agents: Bleomycin, Cisplatin, Etoposide  To help prevent nausea and vomiting after your treatment, we encourage you to take your nausea medication as prescribed.    If you develop nausea and vomiting that is not controlled by your nausea medication, call the clinic.   BELOW ARE SYMPTOMS THAT SHOULD BE REPORTED IMMEDIATELY:  *FEVER GREATER THAN 100.5 F  *CHILLS WITH OR WITHOUT FEVER  NAUSEA AND VOMITING THAT IS NOT CONTROLLED WITH YOUR NAUSEA MEDICATION  *UNUSUAL SHORTNESS OF BREATH  *UNUSUAL BRUISING OR BLEEDING  TENDERNESS IN MOUTH AND THROAT WITH OR WITHOUT PRESENCE OF ULCERS  *URINARY PROBLEMS  *BOWEL PROBLEMS  UNUSUAL RASH Items with * indicate a potential emergency and should be followed up as soon as possible.  Feel free to call the clinic you have any questions or concerns. The clinic phone number is [phone].   Bleomycin injection What is this medicine? BLEOMYCIN (blee oh MYE sin) is a chemotherapy drug. It is used to treat many kinds of cancer like lymphoma, cervical cancer, head and neck cancer, and testicular cancer. It is also used to prevent and to treat fluid build-up around the lungs caused by some cancers. This medicine may be used for other purposes; ask your health care provider or pharmacist if you have questions. COMMON BRAND NAME(S): Blenoxane What should I tell my health care provider before I take this medicine? They need to know if you have any of these conditions: -cigarette smoker -kidney disease -lung disease -recent or ongoing radiation therapy -an unusual or allergic reaction to bleomycin, other chemotherapy agents, other medicines, foods, dyes, or preservatives -pregnant or trying to get pregnant -breast-feeding How should I use this medicine? This drug is given as an infusion into a vein or a body  cavity. It can also be given as an injection into a muscle or under the skin. It is administered in a hospital or clinic by a specially trained health care professional. Talk to your pediatrician regarding the use of this medicine in children. Special care may be needed. Overdosage: If you think you have taken too much of this medicine contact a poison control center or emergency room at once. NOTE: This medicine is only for you. Do not share this medicine with others. What if I miss a dose? It is important not to miss your dose. Call your doctor or health care professional if you are unable to keep an appointment. What may interact with this medicine? -certain antibiotics given by injection -cisplatin -cyclosporine -diuretics -foscarnet -medicines to increase blood counts like filgrastim, pegfilgrastim, sargramostim -vaccines This list may not describe all possible interactions. Give your health care provider a list of all the medicines, herbs, non-prescription drugs, or dietary supplements you use. Also tell them if you smoke, drink alcohol, or use illegal drugs. Some items may interact with your medicine. What should I watch for while using this medicine? Visit your doctor for checks on your progress. This drug may make you feel generally unwell. This is not uncommon, as chemotherapy can affect healthy cells as well as cancer cells. Report any side effects. Continue your course of treatment even though you feel ill unless your doctor tells you to stop. Call your doctor or health care professional for advice if you get a fever, chills or sore throat, or other symptoms of a cold or flu. Do not treat yourself. This drug decreases your body's ability to  fight infections. Try to avoid being around people who are sick. Avoid taking products that contain aspirin, acetaminophen, ibuprofen, naproxen, or ketoprofen unless instructed by your doctor. These medicines may hide a fever. Do not become pregnant  while taking this medicine. Women should inform their doctor if they wish to become pregnant or think they might be pregnant. There is a potential for serious side effects to an unborn child. Talk to your health care professional or pharmacist for more information. Do not breast-feed an infant while taking this medicine. There is a maximum amount of this medicine you should receive throughout your life. The amount depends on the medical condition being treated and your overall health. Your doctor will watch how much of this medicine you receive in your lifetime. Tell your doctor if you have taken this medicine before. What side effects may I notice from receiving this medicine? Side effects that you should report to your doctor or health care professional as soon as possible: -allergic reactions like skin rash, itching or hives, swelling of the face, lips, or tongue -breathing problems -chest pain -confusion -cough -fast, irregular heartbeat -feeling faint or lightheaded, falls -fever or chills -mouth sores -pain, tingling, numbness in the hands or feet -trouble passing urine or change in the amount of urine -yellowing of the eyes or skin Side effects that usually do not require medical attention (report to your doctor or health care professional if they continue or are bothersome): -darker skin color -hair loss -irritation at site where injected -loss of appetite -nail changes -nausea and vomiting -weight loss This list may not describe all possible side effects. Call your doctor for medical advice about side effects. You may report side effects to FDA at 1-800-FDA-1088. Where should I keep my medicine? This drug is given in a hospital or clinic and will not be stored at home. NOTE: This sheet is a summary. It may not cover all possible information. If you have questions about this medicine, talk to your doctor, pharmacist, or health care provider.  2015, Elsevier/Gold Standard.  (2012-12-21 09:36:48)

## 2014-03-15 ENCOUNTER — Ambulatory Visit (HOSPITAL_BASED_OUTPATIENT_CLINIC_OR_DEPARTMENT_OTHER): Payer: Self-pay

## 2014-03-15 VITALS — BP 139/75 | HR 82 | Temp 97.1°F | Resp 20

## 2014-03-15 DIAGNOSIS — C6290 Malignant neoplasm of unspecified testis, unspecified whether descended or undescended: Secondary | ICD-10-CM

## 2014-03-15 DIAGNOSIS — Z5111 Encounter for antineoplastic chemotherapy: Secondary | ICD-10-CM

## 2014-03-15 DIAGNOSIS — C772 Secondary and unspecified malignant neoplasm of intra-abdominal lymph nodes: Secondary | ICD-10-CM

## 2014-03-15 DIAGNOSIS — C6291 Malignant neoplasm of right testis, unspecified whether descended or undescended: Secondary | ICD-10-CM

## 2014-03-15 MED ORDER — SODIUM CHLORIDE 0.9 % IV SOLN
Freq: Once | INTRAVENOUS | Status: AC
  Administered 2014-03-15: 10:00:00 via INTRAVENOUS

## 2014-03-15 MED ORDER — CISPLATIN CHEMO INJECTION 100MG/100ML
20.0000 mg/m2 | Freq: Once | INTRAVENOUS | Status: AC
  Administered 2014-03-15: 40 mg via INTRAVENOUS
  Filled 2014-03-15: qty 40

## 2014-03-15 MED ORDER — PALONOSETRON HCL INJECTION 0.25 MG/5ML
INTRAVENOUS | Status: AC
  Filled 2014-03-15: qty 5

## 2014-03-15 MED ORDER — POTASSIUM CHLORIDE 2 MEQ/ML IV SOLN
Freq: Once | INTRAVENOUS | Status: AC
  Administered 2014-03-15: 10:00:00 via INTRAVENOUS
  Filled 2014-03-15: qty 10

## 2014-03-15 MED ORDER — PALONOSETRON HCL INJECTION 0.25 MG/5ML
0.2500 mg | Freq: Once | INTRAVENOUS | Status: AC
  Administered 2014-03-15: 0.25 mg via INTRAVENOUS

## 2014-03-15 MED ORDER — DEXAMETHASONE SODIUM PHOSPHATE 20 MG/5ML IJ SOLN
INTRAMUSCULAR | Status: AC
  Filled 2014-03-15: qty 5

## 2014-03-15 MED ORDER — DEXAMETHASONE SODIUM PHOSPHATE 20 MG/5ML IJ SOLN
20.0000 mg | Freq: Once | INTRAMUSCULAR | Status: AC
  Administered 2014-03-15: 20 mg via INTRAVENOUS

## 2014-03-15 MED ORDER — ETOPOSIDE CHEMO INJECTION 1 GM/50ML
100.0000 mg/m2 | Freq: Once | INTRAVENOUS | Status: AC
  Administered 2014-03-15: 200 mg via INTRAVENOUS
  Filled 2014-03-15: qty 10

## 2014-03-15 NOTE — Patient Instructions (Signed)
[facility] Discharge Instructions for Patients Receiving Chemotherapy  Today you received the following chemotherapy agents cisplatin and VP-16.  To help prevent nausea and vomiting after your treatment, we encourage you to take your nausea medication compazine.   If you develop nausea and vomiting that is not controlled by your nausea medication, call the clinic.   BELOW ARE SYMPTOMS THAT SHOULD BE REPORTED IMMEDIATELY:  *FEVER GREATER THAN 100.5 F  *CHILLS WITH OR WITHOUT FEVER  NAUSEA AND VOMITING THAT IS NOT CONTROLLED WITH YOUR NAUSEA MEDICATION  *UNUSUAL SHORTNESS OF BREATH  *UNUSUAL BRUISING OR BLEEDING  TENDERNESS IN MOUTH AND THROAT WITH OR WITHOUT PRESENCE OF ULCERS  *URINARY PROBLEMS  *BOWEL PROBLEMS  UNUSUAL RASH Items with * indicate a potential emergency and should be followed up as soon as possible.  Feel free to call the clinic you have any questions or concerns. The clinic phone number is [phone].

## 2014-03-15 NOTE — Progress Notes (Signed)
Pt. Voided 890 pre-cisplat.

## 2014-03-16 ENCOUNTER — Encounter: Payer: Self-pay | Admitting: Oncology

## 2014-03-16 ENCOUNTER — Ambulatory Visit (HOSPITAL_BASED_OUTPATIENT_CLINIC_OR_DEPARTMENT_OTHER): Payer: Self-pay

## 2014-03-16 DIAGNOSIS — Z5111 Encounter for antineoplastic chemotherapy: Secondary | ICD-10-CM

## 2014-03-16 DIAGNOSIS — C6290 Malignant neoplasm of unspecified testis, unspecified whether descended or undescended: Secondary | ICD-10-CM

## 2014-03-16 DIAGNOSIS — C6291 Malignant neoplasm of right testis, unspecified whether descended or undescended: Secondary | ICD-10-CM

## 2014-03-16 MED ORDER — POTASSIUM CHLORIDE 2 MEQ/ML IV SOLN
Freq: Once | INTRAVENOUS | Status: AC
  Administered 2014-03-16: 10:00:00 via INTRAVENOUS
  Filled 2014-03-16: qty 10

## 2014-03-16 MED ORDER — SODIUM CHLORIDE 0.9 % IV SOLN
100.0000 mg/m2 | Freq: Once | INTRAVENOUS | Status: AC
  Administered 2014-03-16: 200 mg via INTRAVENOUS
  Filled 2014-03-16: qty 10

## 2014-03-16 MED ORDER — SODIUM CHLORIDE 0.9 % IV SOLN
20.0000 mg/m2 | Freq: Once | INTRAVENOUS | Status: AC
  Administered 2014-03-16: 40 mg via INTRAVENOUS
  Filled 2014-03-16: qty 40

## 2014-03-16 MED ORDER — DEXAMETHASONE SODIUM PHOSPHATE 20 MG/5ML IJ SOLN
20.0000 mg | Freq: Once | INTRAMUSCULAR | Status: AC
  Administered 2014-03-16: 20 mg via INTRAVENOUS

## 2014-03-16 MED ORDER — DEXAMETHASONE SODIUM PHOSPHATE 20 MG/5ML IJ SOLN
INTRAMUSCULAR | Status: AC
  Filled 2014-03-16: qty 5

## 2014-03-16 MED ORDER — SODIUM CHLORIDE 0.9 % IV SOLN
Freq: Once | INTRAVENOUS | Status: AC
  Administered 2014-03-16: 10:00:00 via INTRAVENOUS

## 2014-03-16 NOTE — Patient Instructions (Signed)
[facility] Discharge Instructions for Patients Receiving Chemotherapy  Today you received the following chemotherapy agents :  Cisplatin & VP-16  To help prevent nausea and vomiting after your treatment, we encourage you to take your nausea medication.   If you develop nausea and vomiting that is not controlled by your nausea medication, call the clinic.   BELOW ARE SYMPTOMS THAT SHOULD BE REPORTED IMMEDIATELY:  *FEVER GREATER THAN 100.5 F  *CHILLS WITH OR WITHOUT FEVER  NAUSEA AND VOMITING THAT IS NOT CONTROLLED WITH YOUR NAUSEA MEDICATION  *UNUSUAL SHORTNESS OF BREATH  *UNUSUAL BRUISING OR BLEEDING  TENDERNESS IN MOUTH AND THROAT WITH OR WITHOUT PRESENCE OF ULCERS  *URINARY PROBLEMS  *BOWEL PROBLEMS  UNUSUAL RASH Items with * indicate a potential emergency and should be followed up as soon as possible.  Feel free to call the clinic you have any questions or concerns. The clinic phone number is [phone].    

## 2014-03-16 NOTE — Progress Notes (Signed)
Called and spoke with DSS-[place] and Ms. Redmond Pulling said nothing has been filed with them. He applied for food stamps back in April. He needs to fill out application.

## 2014-03-17 ENCOUNTER — Ambulatory Visit (HOSPITAL_BASED_OUTPATIENT_CLINIC_OR_DEPARTMENT_OTHER): Payer: Self-pay

## 2014-03-17 VITALS — BP 145/84 | Temp 98.0°F | Resp 18

## 2014-03-17 DIAGNOSIS — C6290 Malignant neoplasm of unspecified testis, unspecified whether descended or undescended: Secondary | ICD-10-CM

## 2014-03-17 DIAGNOSIS — Z5111 Encounter for antineoplastic chemotherapy: Secondary | ICD-10-CM

## 2014-03-17 DIAGNOSIS — C6291 Malignant neoplasm of right testis, unspecified whether descended or undescended: Secondary | ICD-10-CM

## 2014-03-17 MED ORDER — PALONOSETRON HCL INJECTION 0.25 MG/5ML
INTRAVENOUS | Status: AC
  Filled 2014-03-17: qty 5

## 2014-03-17 MED ORDER — SODIUM CHLORIDE 0.9 % IV SOLN
20.0000 mg/m2 | Freq: Once | INTRAVENOUS | Status: AC
  Administered 2014-03-17: 40 mg via INTRAVENOUS
  Filled 2014-03-17: qty 40

## 2014-03-17 MED ORDER — DEXAMETHASONE SODIUM PHOSPHATE 20 MG/5ML IJ SOLN
INTRAMUSCULAR | Status: AC
  Filled 2014-03-17: qty 5

## 2014-03-17 MED ORDER — POTASSIUM CHLORIDE 2 MEQ/ML IV SOLN
Freq: Once | INTRAVENOUS | Status: AC
  Administered 2014-03-17: 11:00:00 via INTRAVENOUS
  Filled 2014-03-17: qty 10

## 2014-03-17 MED ORDER — SODIUM CHLORIDE 0.9 % IV SOLN
Freq: Once | INTRAVENOUS | Status: AC
  Administered 2014-03-17: 11:00:00 via INTRAVENOUS

## 2014-03-17 MED ORDER — SODIUM CHLORIDE 0.9 % IV SOLN
100.0000 mg/m2 | Freq: Once | INTRAVENOUS | Status: AC
  Administered 2014-03-17: 200 mg via INTRAVENOUS
  Filled 2014-03-17: qty 10

## 2014-03-17 MED ORDER — DEXAMETHASONE SODIUM PHOSPHATE 20 MG/5ML IJ SOLN
20.0000 mg | Freq: Once | INTRAMUSCULAR | Status: AC
  Administered 2014-03-17: 20 mg via INTRAVENOUS

## 2014-03-17 MED ORDER — PALONOSETRON HCL INJECTION 0.25 MG/5ML
0.2500 mg | Freq: Once | INTRAVENOUS | Status: AC
  Administered 2014-03-17: 0.25 mg via INTRAVENOUS

## 2014-03-17 NOTE — Patient Instructions (Signed)
[place] Discharge Instructions for Patients Receiving Chemotherapy  Today you received the following chemotherapy agents Cisplatin, etoposide (VP-16).  To help prevent nausea and vomiting after your treatment, we encourage you to take your nausea medication Compazine 10 mg every six hours OR 30 minutes before meals and at bedtime if needed.   If you develop nausea and vomiting that is not controlled by your nausea medication, call the clinic.   BELOW ARE SYMPTOMS THAT SHOULD BE REPORTED IMMEDIATELY:  *FEVER GREATER THAN 100.5 F  *CHILLS WITH OR WITHOUT FEVER  NAUSEA AND VOMITING THAT IS NOT CONTROLLED WITH YOUR NAUSEA MEDICATION  *UNUSUAL SHORTNESS OF BREATH  *UNUSUAL BRUISING OR BLEEDING  TENDERNESS IN MOUTH AND THROAT WITH OR WITHOUT PRESENCE OF ULCERS  *URINARY PROBLEMS  *BOWEL PROBLEMS  UNUSUAL RASH Items with * indicate a potential emergency and should be followed up as soon as possible.  Feel free to call the clinic you have any questions or concerns. The clinic phone number is [phone].

## 2014-03-17 NOTE — Progress Notes (Signed)
Discharged at 1650 to lobby.  Family here to pick him up.

## 2014-03-17 NOTE — Progress Notes (Signed)
Opsite dressing used.  Redness noted to left forearm from yesterday's IV.  VP-16 ran 1.5 hrs.  (Phaseal in use)

## 2014-03-20 ENCOUNTER — Telehealth: Payer: Self-pay | Admitting: Medical Oncology

## 2014-03-20 ENCOUNTER — Telehealth: Payer: Self-pay | Admitting: *Deleted

## 2014-03-20 NOTE — Telephone Encounter (Signed)
REQUESTED A RETURN CALL TO THIS OFFICE. 

## 2014-03-20 NOTE — Telephone Encounter (Signed)
Last office note (June 17th, 2015) faxed to [facility] per their request.

## 2014-03-21 ENCOUNTER — Ambulatory Visit (HOSPITAL_BASED_OUTPATIENT_CLINIC_OR_DEPARTMENT_OTHER): Payer: Self-pay | Admitting: Nurse Practitioner

## 2014-03-21 ENCOUNTER — Ambulatory Visit (HOSPITAL_BASED_OUTPATIENT_CLINIC_OR_DEPARTMENT_OTHER): Payer: Self-pay

## 2014-03-21 ENCOUNTER — Encounter: Payer: Self-pay | Admitting: Oncology

## 2014-03-21 ENCOUNTER — Other Ambulatory Visit: Payer: Self-pay | Admitting: Nurse Practitioner

## 2014-03-21 ENCOUNTER — Other Ambulatory Visit (HOSPITAL_BASED_OUTPATIENT_CLINIC_OR_DEPARTMENT_OTHER): Payer: Self-pay

## 2014-03-21 VITALS — BP 107/87 | HR 83 | Temp 98.1°F | Resp 16

## 2014-03-21 VITALS — BP 122/79 | HR 91 | Temp 97.8°F | Resp 18 | Ht 70.0 in | Wt 178.8 lb

## 2014-03-21 DIAGNOSIS — K1231 Oral mucositis (ulcerative) due to antineoplastic therapy: Secondary | ICD-10-CM

## 2014-03-21 DIAGNOSIS — R5381 Other malaise: Secondary | ICD-10-CM

## 2014-03-21 DIAGNOSIS — R634 Abnormal weight loss: Secondary | ICD-10-CM

## 2014-03-21 DIAGNOSIS — R439 Unspecified disturbances of smell and taste: Secondary | ICD-10-CM

## 2014-03-21 DIAGNOSIS — C6291 Malignant neoplasm of right testis, unspecified whether descended or undescended: Secondary | ICD-10-CM

## 2014-03-21 DIAGNOSIS — C6290 Malignant neoplasm of unspecified testis, unspecified whether descended or undescended: Secondary | ICD-10-CM

## 2014-03-21 DIAGNOSIS — R110 Nausea: Secondary | ICD-10-CM

## 2014-03-21 DIAGNOSIS — Z5111 Encounter for antineoplastic chemotherapy: Secondary | ICD-10-CM

## 2014-03-21 DIAGNOSIS — R197 Diarrhea, unspecified: Secondary | ICD-10-CM

## 2014-03-21 DIAGNOSIS — R432 Parageusia: Secondary | ICD-10-CM

## 2014-03-21 DIAGNOSIS — R5383 Other fatigue: Secondary | ICD-10-CM

## 2014-03-21 LAB — COMPREHENSIVE METABOLIC PANEL (CC13)
ALK PHOS: 68 U/L (ref 40–150)
ALT: 44 U/L (ref 0–55)
AST: 30 U/L (ref 5–34)
Albumin: 3.8 g/dL (ref 3.5–5.0)
Anion Gap: 10 mEq/L (ref 3–11)
BUN: 21.1 mg/dL (ref 7.0–26.0)
CO2: 23 mEq/L (ref 22–29)
Calcium: 9.4 mg/dL (ref 8.4–10.4)
Chloride: 100 mEq/L (ref 98–109)
Creatinine: 0.9 mg/dL (ref 0.7–1.3)
Glucose: 104 mg/dl (ref 70–140)
POTASSIUM: 4.2 meq/L (ref 3.5–5.1)
Sodium: 134 mEq/L — ABNORMAL LOW (ref 136–145)
Total Bilirubin: 0.32 mg/dL (ref 0.20–1.20)
Total Protein: 8 g/dL (ref 6.4–8.3)

## 2014-03-21 LAB — CBC WITH DIFFERENTIAL/PLATELET
BASO%: 0 % (ref 0.0–2.0)
BASOS ABS: 0 10*3/uL (ref 0.0–0.1)
EOS%: 0.7 % (ref 0.0–7.0)
Eosinophils Absolute: 0 10*3/uL (ref 0.0–0.5)
HCT: 38.7 % (ref 38.4–49.9)
HEMOGLOBIN: 13.1 g/dL (ref 13.0–17.1)
LYMPH%: 38.4 % (ref 14.0–49.0)
MCH: 27.3 pg (ref 27.2–33.4)
MCHC: 33.9 g/dL (ref 32.0–36.0)
MCV: 80.6 fL (ref 79.3–98.0)
MONO#: 0 10*3/uL — ABNORMAL LOW (ref 0.1–0.9)
MONO%: 0 % (ref 0.0–14.0)
NEUT#: 2.6 10*3/uL (ref 1.5–6.5)
NEUT%: 60.9 % (ref 39.0–75.0)
Platelets: 168 10*3/uL (ref 140–400)
RBC: 4.8 10*6/uL (ref 4.20–5.82)
RDW: 13 % (ref 11.0–14.6)
WBC: 4.3 10*3/uL (ref 4.0–10.3)
lymph#: 1.6 10*3/uL (ref 0.9–3.3)

## 2014-03-21 MED ORDER — SODIUM CHLORIDE 0.9 % IV SOLN
Freq: Once | INTRAVENOUS | Status: AC
  Administered 2014-03-21: 11:00:00 via INTRAVENOUS

## 2014-03-21 MED ORDER — PROCHLORPERAZINE MALEATE 10 MG PO TABS
ORAL_TABLET | ORAL | Status: AC
Start: 2014-03-21 — End: 2014-03-21
  Filled 2014-03-21: qty 1

## 2014-03-21 MED ORDER — PROCHLORPERAZINE MALEATE 10 MG PO TABS
10.0000 mg | ORAL_TABLET | Freq: Once | ORAL | Status: AC
  Administered 2014-03-21: 10 mg via ORAL

## 2014-03-21 MED ORDER — SODIUM CHLORIDE 0.9 % IV SOLN
30.0000 [IU] | Freq: Once | INTRAVENOUS | Status: AC
  Administered 2014-03-21: 30 [IU] via INTRAVENOUS
  Filled 2014-03-21: qty 10

## 2014-03-21 NOTE — Progress Notes (Signed)
Spoke with patient and he advised he applied for SSI. I advised him to make sure he applies for medicaid as soon as he can.

## 2014-03-21 NOTE — Patient Instructions (Signed)
[facility] Discharge Instructions for Patients Receiving Chemotherapy  Today you received the following chemotherapy agents:  Bleomycin  To help prevent nausea and vomiting after your treatment, we encourage you to take your nausea medication. If you develop nausea and vomiting that is not controlled by your nausea medication, call the clinic.   BELOW ARE SYMPTOMS THAT SHOULD BE REPORTED IMMEDIATELY:  *FEVER GREATER THAN 100.5 F  *CHILLS WITH OR WITHOUT FEVER  NAUSEA AND VOMITING THAT IS NOT CONTROLLED WITH YOUR NAUSEA MEDICATION  *UNUSUAL SHORTNESS OF BREATH  *UNUSUAL BRUISING OR BLEEDING  TENDERNESS IN MOUTH AND THROAT WITH OR WITHOUT PRESENCE OF ULCERS  *URINARY PROBLEMS  *BOWEL PROBLEMS  UNUSUAL RASH Items with * indicate a potential emergency and should be followed up as soon as possible.  Feel free to call the clinic you have any questions or concerns. The clinic phone number is [phone].

## 2014-03-23 ENCOUNTER — Other Ambulatory Visit: Payer: Self-pay | Admitting: Oncology

## 2014-03-23 ENCOUNTER — Encounter: Payer: Self-pay | Admitting: Nurse Practitioner

## 2014-03-23 ENCOUNTER — Other Ambulatory Visit: Payer: Self-pay | Admitting: Medical Oncology

## 2014-03-23 ENCOUNTER — Encounter: Payer: Self-pay | Admitting: Medical Oncology

## 2014-03-23 DIAGNOSIS — C6291 Malignant neoplasm of right testis, unspecified whether descended or undescended: Secondary | ICD-10-CM

## 2014-03-23 DIAGNOSIS — R197 Diarrhea, unspecified: Secondary | ICD-10-CM | POA: Insufficient documentation

## 2014-03-23 DIAGNOSIS — R634 Abnormal weight loss: Secondary | ICD-10-CM | POA: Insufficient documentation

## 2014-03-23 DIAGNOSIS — R432 Parageusia: Secondary | ICD-10-CM | POA: Insufficient documentation

## 2014-03-23 DIAGNOSIS — K1231 Oral mucositis (ulcerative) due to antineoplastic therapy: Secondary | ICD-10-CM | POA: Insufficient documentation

## 2014-03-23 DIAGNOSIS — R5383 Other fatigue: Secondary | ICD-10-CM | POA: Insufficient documentation

## 2014-03-23 DIAGNOSIS — R110 Nausea: Secondary | ICD-10-CM | POA: Insufficient documentation

## 2014-03-23 MED ORDER — PROCHLORPERAZINE MALEATE 10 MG PO TABS: 10.0000 mg | ORAL_TABLET | Freq: Four times a day (QID) | ORAL | Status: DC | PRN

## 2014-03-23 NOTE — Assessment & Plan Note (Signed)
Chemotherapy-induced mucositis.  Patient was encouraged to use baking soda/salt swish and spit on a regular basis throughout the day.

## 2014-03-23 NOTE — Assessment & Plan Note (Signed)
Patient does complain of decreased taste sensation; as well as some mild mucositis.  He has also noted some mild nausea-which is resolved with anti--nausea medications he already has at home.  He has noted a 4 pound weight loss since his last weight check.  Patient was advised to use baking soda/salt solution and spit several times throughout the day to help with mucositis issues patient was encouraged to continue with his anti--nausea medications.  He was encouraged to push fluids and eat multiple small meals throughout the day.

## 2014-03-23 NOTE — Telephone Encounter (Signed)
Patient requesting refill to compazine. Per MD, ok to refill. Patient informed. Denies any questions.

## 2014-03-23 NOTE — Assessment & Plan Note (Signed)
Patient is also complaining of some intermittent, mild diarrhea.  He states he has not tried any Imodium as of yet.  Advised Imodium on a when necessary basis.

## 2014-03-23 NOTE — Assessment & Plan Note (Signed)
Patient is complaining of some very mild nausea; but states that his anti--nausea medications at the R. he has home do seem to help.

## 2014-03-23 NOTE — Assessment & Plan Note (Signed)
This is a chemotherapy side effect as well.  Hopefully this will resolve once he is finished with all of his chemotherapy.

## 2014-03-23 NOTE — Progress Notes (Signed)
[name]   Chief Complaint  Patient presents with  . Follow-up    HPI: Jeremy Clay 33 y.o. male recently diagnosed with testicular cancer.  Currently undergoing BEP chemotherapy regimen.  Patient states that he's been doing fairly well recently.  He is complaining of some mild, chronic fatigue; and some decrease in appetite.  He states that he has a decreased taste sensation now a; and has lost approximately 4 pounds since his last weight check.  He also notes a very mild mucositis issues; stated that he has not been using the baking soda/salt swish and spit mouth rinse.  He also sets of intermittent nausea and occasional diarrhea as well.  Patient also is requesting a Port-A-Cath placement at this time; stating that IV access has been a problem for him since initiating chemotherapy.  He denies any recent fever or chills.  CURRENT THERAPY: Upcoming Treatment Dates - TESTICULAR BEP q21d Days with orders from any treatment category:  03/28/2014      SCHEDULING COMMUNICATION      prochlorperazine (COMPAZINE) tablet 10 mg      bleomycin (BLEOCIN) 30 Units in sodium chloride 0.9 % 50 mL chemo infusion      sodium chloride 0.9 % injection 10 mL      heparin lock flush 100 unit/mL      heparin lock flush 100 unit/mL      alteplase (CATHFLO ACTIVASE) injection 2 mg      sodium chloride 0.9 % injection 3 mL      Cold Pack 1 packet      0.9 %  sodium chloride infusion      BLEOMYCIN TREATMENT CONDITION 04/03/2014      SCHEDULING COMMUNICATION      palonosetron (ALOXI) injection 0.25 mg      Dexamethasone Sodium Phosphate (DECADRON) injection 12 mg      fosaprepitant (EMEND) 150 mg in sodium chloride 0.9 % 145 mL IVPB      CISplatin (PLATINOL) 40 mg in sodium chloride 0.9 % 250 mL chemo infusion      etoposide (VEPESID) 200 mg in sodium chloride 0.9 % 500 mL chemo infusion      sodium chloride 0.9 % injection 10 mL      heparin lock flush 100 unit/mL      heparin  lock flush 100 unit/mL      alteplase (CATHFLO ACTIVASE) injection 2 mg      sodium chloride 0.9 % injection 3 mL      Hot Pack 1 packet      0.9 %  sodium chloride infusion      dextrose 5 % and 0.45% NaCl 1,000 mL with potassium chloride 20 mEq, magnesium sulfate 12 mEq infusion      TREATMENT CONDITIONS 04/04/2014      SCHEDULING COMMUNICATION      Dexamethasone Sodium Phosphate (DECADRON) injection 20 mg      bleomycin (BLEOCIN) 30 Units in sodium chloride 0.9 % 50 mL chemo infusion      CISplatin (PLATINOL) 40 mg in sodium chloride 0.9 % 250 mL chemo infusion      etoposide (VEPESID) 200 mg in sodium chloride 0.9 % 500 mL chemo infusion      sodium chloride 0.9 % injection 10 mL      heparin lock flush 100 unit/mL      heparin lock flush 100 unit/mL      alteplase (CATHFLO ACTIVASE) injection 2 mg  sodium chloride 0.9 % injection 3 mL      Hot Pack 1 packet      Cold Pack 1 packet      0.9 %  sodium chloride infusion      dextrose 5 % and 0.45% NaCl 1,000 mL with potassium chloride 20 mEq, magnesium sulfate 12 mEq infusion      CISPLATIN TREATMENT CONDITION   Past Medical History  Diagnosis Date  . Herniated disc   . Testicular mass     right  . 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     Past Surgical History  Procedure Laterality Date  . Orchiectomy Right 02/07/2014    Procedure: RIGHT RADICAL ORCHIECTOMY;  Surgeon: Malka So, MD;  Location: [facility];  Service: Urology;  Laterality: Right;    has ANKLE SPRAIN, LEFT; Right testicular cancer; Fatigue; Weight loss; Altered taste; Nausea alone; Diarrhea; and Mucositis (ulcerative) due to antineoplastic therapy on his problem list.     is allergic to tegaderm ag mesh.    Medication List       This list is accurate as of: 03/21/14 11:59 PM.  Always use your most recent med list.               GOODYS BODY PAIN PO  Take  1 packet by mouth daily as needed (pain).     HYDROcodone-acetaminophen 5-325 MG per tablet  Commonly known as:  NORCO  Take 1 tablet by mouth every 6 (six) hours as needed for moderate pain.     ibuprofen 200 MG tablet  Commonly known as:  ADVIL,MOTRIN  Take 600 mg by mouth every 6 (six) hours as needed for moderate pain.     naproxen sodium 220 MG tablet  Commonly known as:  ANAPROX  Take 220 mg by mouth 2 (two) times daily with a meal.     prochlorperazine 10 MG tablet  Commonly known as:  COMPAZINE  Take 1 tablet (10 mg total) by mouth every 6 (six) hours as needed for nausea or vomiting.          PHYSICAL EXAMINATION  Filed Vitals:   03/21/14 1020  BP: 122/79  Pulse: 91  Temp: 97.8 F (36.6 C)  Resp: 18    GENERAL:alert, healthy, no distress, well nourished and well developed SKIN: skin color, texture, turgor are normal, no rashes or significant lesions HEAD: Normocephalic, No masses, lesions, tenderness or abnormalities EYES: PERRLA, Conjunctiva are pink and non-injected OROPHARYNX:no exudate and mild erythema  NECK: supple, no adenopathy, no bruits, no JVD LYMPH:  no palpable lymphadenopathy BREAST:not examined LUNGS: negative findings:  normal respiratory rate and rhythm, lungs clear to auscultation HEART: regular rate & rhythm, no murmurs and no gallops ABDOMEN:abdomen soft, non-tender, normal bowel sounds and no masses or organomegaly BACK: No CVA tenderness, Range of motion is normal EXTREMITIES:no edema, no clubbing, no cyanosis  NEURO: alert & oriented x 3 with fluent speech, gait normal  LABORATORY DATA:. CBC  Lab Results  Component Value Date   WBC 4.3 03/21/2014   RBC 4.80 03/21/2014   HGB 13.1 03/21/2014   HCT 38.7 03/21/2014   PLT 168 03/21/2014   MCV 80.6 03/21/2014   MCH 27.3 03/21/2014   MCHC 33.9 03/21/2014   RDW 13.0 03/21/2014   LYMPHSABS 1.6 03/21/2014   MONOABS 0.0* 03/21/2014   EOSABS 0.0 03/21/2014   BASOSABS 0.0 03/21/2014  CMET  Lab Results  Component Value Date   NA 134* 03/21/2014   K 4.2 03/21/2014   CO2 23 03/21/2014   GLUCOSE 104 03/21/2014   BUN 21.1 03/21/2014   CREATININE 0.9 03/21/2014   CALCIUM 9.4 03/21/2014   PROT 8.0 03/21/2014   ALBUMIN 3.8 03/21/2014   AST 30 03/21/2014   ALT 44 03/21/2014   ALKPHOS 68 03/21/2014   BILITOT 0.32 03/21/2014   ASSESSMENT/PLAN:    Right testicular cancer  Assessment & Plan Patient initiated his BEPchemotherapy  03/13/2014.  After careful review with the patient of his most recent lab results-patient will proceed today with cycle 1, day 9 of the we'll myosin only portion of his BEP chemotherapy regimen.  He has plans to return in one week for continuation of the same chemotherapy regimen.   Fatigue  Assessment & Plan This is most likely chemotherapy side effect.  Patient was encouraged to remain as is possible.   Weight loss  Assessment & Plan Patient does complain of decreased taste sensation; as well as some mild mucositis.  He has also noted some mild nausea-which is resolved with anti--nausea medications he already has at home.  He has noted a 4 pound weight loss since his last weight check.  Patient was advised to use baking soda/salt solution and spit several times throughout the day to help with mucositis issues patient was encouraged to continue with his anti--nausea medications.  He was encouraged to push fluids and eat multiple small meals throughout the day.   Altered taste  Assessment & Plan This is a chemotherapy side effect as well.  Hopefully this will resolve once he is finished with all of his chemotherapy.   Nausea alone  Assessment & Plan Patient is complaining of some very mild nausea; but states that his anti--nausea medications at the R. he has home do seem to help.   Diarrhea  Assessment & Plan Patient is also complaining of some intermittent, mild diarrhea.  He states he has not tried any Imodium as of yet.  Advised Imodium on  a when necessary basis.   Mucositis (ulcerative) due to antineoplastic therapy  Assessment & Plan Chemotherapy-induced mucositis.  Patient was encouraged to use baking soda/salt swish and spit on a regular basis throughout the day.   Patient stated understanding of all instructions; and was in agreement with this plan of care. The patient knows to call the clinic with any problems, questions or concerns.   Review/collaboration with Dr. Alen Blew regarding all aspects of patient's visit today.   Total time spent with patient was 25 minutes;  with greater than 75 percent of that time spent in face to face counseling regarding his recent symptoms; and management of side effects;  and coordination of care and follow up.  Disclaimer: This note was dictated with voice recognition software. Similar sounding words can inadvertently be transcribed and may not be corrected upon review.   Drue Second, NP 03/23/2014

## 2014-03-23 NOTE — Assessment & Plan Note (Addendum)
Patient initiated his BEPchemotherapy  03/13/2014.  After careful review with the patient of his most recent lab results-patient will proceed today with cycle 1, day 9 of the we'll myosin only portion of his BEP chemotherapy regimen.  He has plans to return in one week for continuation of the same chemotherapy regimen.

## 2014-03-23 NOTE — Assessment & Plan Note (Signed)
This is most likely chemotherapy side effect.  Patient was encouraged to remain as is possible.

## 2014-03-24 ENCOUNTER — Other Ambulatory Visit: Payer: Self-pay | Admitting: Oncology

## 2014-03-27 ENCOUNTER — Telehealth: Payer: Self-pay | Admitting: Oncology

## 2014-03-27 NOTE — Telephone Encounter (Signed)
lvm for pt regardign to not being able to r/s the port placement due to noavailability per Tiffany in IR

## 2014-03-28 ENCOUNTER — Ambulatory Visit (HOSPITAL_BASED_OUTPATIENT_CLINIC_OR_DEPARTMENT_OTHER): Payer: Self-pay | Admitting: Oncology

## 2014-03-28 ENCOUNTER — Other Ambulatory Visit: Payer: Self-pay | Admitting: Radiology

## 2014-03-28 ENCOUNTER — Ambulatory Visit (HOSPITAL_BASED_OUTPATIENT_CLINIC_OR_DEPARTMENT_OTHER): Payer: Self-pay

## 2014-03-28 ENCOUNTER — Other Ambulatory Visit (HOSPITAL_BASED_OUTPATIENT_CLINIC_OR_DEPARTMENT_OTHER): Payer: Self-pay

## 2014-03-28 ENCOUNTER — Telehealth: Payer: Self-pay | Admitting: Oncology

## 2014-03-28 VITALS — BP 133/76 | HR 84 | Temp 98.0°F | Resp 18 | Ht 70.0 in | Wt 177.6 lb

## 2014-03-28 DIAGNOSIS — Z5111 Encounter for antineoplastic chemotherapy: Secondary | ICD-10-CM

## 2014-03-28 DIAGNOSIS — L02818 Cutaneous abscess of other sites: Secondary | ICD-10-CM

## 2014-03-28 DIAGNOSIS — C6290 Malignant neoplasm of unspecified testis, unspecified whether descended or undescended: Secondary | ICD-10-CM

## 2014-03-28 DIAGNOSIS — C6291 Malignant neoplasm of right testis, unspecified whether descended or undescended: Secondary | ICD-10-CM

## 2014-03-28 DIAGNOSIS — L03818 Cellulitis of other sites: Secondary | ICD-10-CM

## 2014-03-28 LAB — COMPREHENSIVE METABOLIC PANEL (CC13)
ALBUMIN: 3.1 g/dL — AB (ref 3.5–5.0)
ALK PHOS: 83 U/L (ref 40–150)
ALT: 35 U/L (ref 0–55)
AST: 23 U/L (ref 5–34)
Anion Gap: 11 mEq/L (ref 3–11)
BUN: 7.7 mg/dL (ref 7.0–26.0)
CO2: 25 mEq/L (ref 22–29)
Calcium: 9.9 mg/dL (ref 8.4–10.4)
Chloride: 103 mEq/L (ref 98–109)
Creatinine: 1 mg/dL (ref 0.7–1.3)
Glucose: 131 mg/dl (ref 70–140)
Potassium: 3.9 mEq/L (ref 3.5–5.1)
SODIUM: 139 meq/L (ref 136–145)
TOTAL PROTEIN: 8 g/dL (ref 6.4–8.3)

## 2014-03-28 LAB — CBC WITH DIFFERENTIAL/PLATELET
BASO%: 1.8 % (ref 0.0–2.0)
Basophils Absolute: 0.1 10*3/uL (ref 0.0–0.1)
EOS%: 1.3 % (ref 0.0–7.0)
Eosinophils Absolute: 0.1 10*3/uL (ref 0.0–0.5)
HCT: 33.7 % — ABNORMAL LOW (ref 38.4–49.9)
HEMOGLOBIN: 11.5 g/dL — AB (ref 13.0–17.1)
LYMPH%: 39.3 % (ref 14.0–49.0)
MCH: 27.3 pg (ref 27.2–33.4)
MCHC: 34.1 g/dL (ref 32.0–36.0)
MCV: 80 fL (ref 79.3–98.0)
MONO#: 1.1 10*3/uL — ABNORMAL HIGH (ref 0.1–0.9)
MONO%: 28.3 % — ABNORMAL HIGH (ref 0.0–14.0)
NEUT#: 1.2 10*3/uL — ABNORMAL LOW (ref 1.5–6.5)
NEUT%: 29.3 % — ABNORMAL LOW (ref 39.0–75.0)
Platelets: 129 10*3/uL — ABNORMAL LOW (ref 140–400)
RBC: 4.21 10*6/uL (ref 4.20–5.82)
RDW: 12.9 % (ref 11.0–14.6)
WBC: 4 10*3/uL (ref 4.0–10.3)
lymph#: 1.6 10*3/uL (ref 0.9–3.3)
nRBC: 0 % (ref 0–0)

## 2014-03-28 LAB — TECHNOLOGIST REVIEW: Technologist Review: 3

## 2014-03-28 MED ORDER — CEPHALEXIN 500 MG PO CAPS: 500.0000 mg | ORAL_CAPSULE | Freq: Two times a day (BID) | ORAL | Status: DC

## 2014-03-28 MED ORDER — SODIUM CHLORIDE 0.9 % IV SOLN
30.0000 [IU] | Freq: Once | INTRAVENOUS | Status: AC
  Administered 2014-03-28: 30 [IU] via INTRAVENOUS
  Filled 2014-03-28: qty 10

## 2014-03-28 MED ORDER — CEPHALEXIN 500 MG PO CAPS
500.0000 mg | ORAL_CAPSULE | Freq: Two times a day (BID) | ORAL | Status: DC
Start: 2014-03-28 — End: 2014-03-28

## 2014-03-28 MED ORDER — SODIUM CHLORIDE 0.9 % IV SOLN
Freq: Once | INTRAVENOUS | Status: AC
  Administered 2014-03-28: 11:00:00 via INTRAVENOUS

## 2014-03-28 MED ORDER — PROCHLORPERAZINE MALEATE 10 MG PO TABS
ORAL_TABLET | ORAL | Status: AC
  Filled 2014-03-28: qty 1

## 2014-03-28 MED ORDER — PROCHLORPERAZINE MALEATE 10 MG PO TABS
10.0000 mg | ORAL_TABLET | Freq: Once | ORAL | Status: AC
  Administered 2014-03-28: 10 mg via ORAL

## 2014-03-28 NOTE — Progress Notes (Signed)
Hematology and Oncology Follow Up Visit  Jeremy Clay [id number] 1980/09/20 33 y.o. 03/28/2014 10:11 AM   Principle Diagnosis: 34 year old gentleman with testicular cancer diagnosed in June of 2015. He presented with a large testicular mass and found to have a stage IIB disease with periaortic lymphadenopathy. 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   Prior Therapy: He is status post radical nephrectomy on 02/07/2014. The tumor measured 7.3 cm showed 10% embryonal and 80%,. He also 10% yolk sac tumor.  Current therapy: He is currently receiving BEP chemotherapy today is 16 of cycle 1.  Interim History:  Jeremy Clay presents today for a followup visit. He is tolerating systemic chemotherapy without any complications. He did have grade 1 nausea that have resolved with anti-emetics. He did have one episode of vomiting that resolved spontaneously. He did have mouth pain associated with chemotherapy that resolved with Magic mouthwash. He does have grade 1 fatigue but for the most part tolerated chemotherapy without any serious complications. He does not report any chest pain or difficulty breathing. Did not report any cough or hemoptysis. He did have low-grade fever of less than 100. He is reporting some redness around the incision site. It is not reporting any chills or sweats or weight loss. Does not report any constipation or diarrhea or hematochezia. Is on current frequency urgency or hesitancy. Does not report any skeletal complaints. Is not reporting any lymphadenopathy or petechiae. Review of systems unremarkable.  Medications: I have reviewed the patient's current medications.  Current Outpatient Prescriptions  Medication Sig Dispense Refill  . Aspirin-Acetaminophen (GOODYS BODY PAIN PO) Take 1 packet by mouth daily as needed (pain).      . cephALEXin (KEFLEX) 500 MG capsule Take 1 capsule (500 mg total) by mouth 2 (two) times  daily.  14 capsule  0  . HYDROcodone-acetaminophen (NORCO) 5-325 MG per tablet Take 1 tablet by mouth every 6 (six) hours as needed for moderate pain.  30 tablet  0  . ibuprofen (ADVIL,MOTRIN) 200 MG tablet Take 600 mg by mouth every 6 (six) hours as needed for moderate pain.      . naproxen sodium (ANAPROX) 220 MG tablet Take 220 mg by mouth 2 (two) times daily with a meal.      . prochlorperazine (COMPAZINE) 10 MG tablet Take 1 tablet (10 mg total) by mouth every 6 (six) hours as needed for nausea or vomiting.  30 tablet  0   No current facility-administered medications for this visit.     Allergies:  Allergies  Allergen Reactions  . Tegaderm Ag Mesh [Silver] Rash    Redness & irritation noted from IV site dressing 24 hrs after d/c'd     Past Medical History, Surgical history, Social history, and Family History were reviewed and updated.   Physical Exam: Blood pressure 133/76, pulse 84, temperature 98 F (36.7 C), temperature source Oral, resp. rate 18, height 5\' 10"  (1.778 m), weight 177 lb 9.6 oz (80.559 kg), SpO2 99.00%. ECOG:  General appearance: alert and cooperative Head: Normocephalic, without obvious abnormality Neck: no adenopathy Lymph nodes: Cervical, supraclavicular, and axillary nodes normal. Heart:regular rate and rhythm, S1, S2 normal, no murmur, click, rub or gallop Lung:chest clear, no wheezing, rales, normal symmetric air entry Abdomin: soft, non-tender, without masses or organomegaly EXT:no erythema, induration, or nodules His incision site appeared dry and intact erythema noted without any tenderness to touch. This is located in the right inguinal area  Lab Results: Lab Results  Component Value Date   WBC 4.0 03/28/2014   HGB 11.5* 03/28/2014   HCT 33.7* 03/28/2014   MCV 80.0 03/28/2014   PLT 129* 03/28/2014     Chemistry      Component Value Date/Time   NA 139 03/28/2014 0915   K 3.9 03/28/2014 0915   CO2 25 03/28/2014 0915   BUN 7.7 03/28/2014 0915    CREATININE 1.0 03/28/2014 0915      Component Value Date/Time   CALCIUM 9.9 03/28/2014 0915   ALKPHOS 83 03/28/2014 0915   AST 23 03/28/2014 0915   ALT 35 03/28/2014 0915   BILITOT <0.20 03/28/2014 0915         Impression and Plan:  33 year old gentleman with the following issues:  1. Testicular cancer diagnosed in June of 2015. He presented with a large right testicular mass and found to have a stage IIB disease. 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 He is receiving BEP chemotherapy without any complications. The plan is to proceed with day 16 of cycle 1 today. He is ready to proceed with cycle 2 on 04/03/2014.  2. IV access: He is ready to proceed with Port-A-Cath which will be inserted on 03/30/2014.  3. Antiemetics: He has a prescription for Compazine which have helped his symptoms.  4. 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  5. Right groin cellulitis: I instructed him to apply antibiotic ointment I also if him a prescription for oral antibiotics to take as well. He also is clear structures cough he develops any fever or increased treatments of that area.  6. Followup: Will be on 04/03/2014 4 date 1 of cycle 2. He well  have a clinical visit on 8/4 and 04/18/2014.    [facility], MD 7/21/201510:11 AM

## 2014-03-28 NOTE — Telephone Encounter (Signed)
Pt confirmed labs/ov sent msg to [facility] to order chemo per 07/21 POF, gave pt AVS....KJ

## 2014-03-28 NOTE — Addendum Note (Signed)
Addended by: Randolm Idol on: 03/28/2014 10:50 AM   Modules accepted: Orders

## 2014-03-28 NOTE — Patient Instructions (Signed)
[facility] Discharge Instructions for Patients Receiving Chemotherapy  Today you received the following chemotherapy agents Bleomycin.  To help prevent nausea and vomiting after your treatment, we encourage you to take your nausea medication.   If you develop nausea and vomiting that is not controlled by your nausea medication, call the clinic.   BELOW ARE SYMPTOMS THAT SHOULD BE REPORTED IMMEDIATELY:  *FEVER GREATER THAN 100.5 F  *CHILLS WITH OR WITHOUT FEVER  NAUSEA AND VOMITING THAT IS NOT CONTROLLED WITH YOUR NAUSEA MEDICATION  *UNUSUAL SHORTNESS OF BREATH  *UNUSUAL BRUISING OR BLEEDING  TENDERNESS IN MOUTH AND THROAT WITH OR WITHOUT PRESENCE OF ULCERS  *URINARY PROBLEMS  *BOWEL PROBLEMS  UNUSUAL RASH Items with * indicate a potential emergency and should be followed up as soon as possible.  Feel free to call the clinic you have any questions or concerns. The clinic phone number is [phone].

## 2014-03-29 ENCOUNTER — Telehealth: Payer: Self-pay | Admitting: *Deleted

## 2014-03-29 NOTE — Telephone Encounter (Signed)
Per staff message and POF I have scheduled appts. Advised scheduler of appts. JMW  

## 2014-03-30 ENCOUNTER — Other Ambulatory Visit: Payer: Self-pay | Admitting: Oncology

## 2014-03-30 ENCOUNTER — Encounter (HOSPITAL_COMMUNITY): Payer: Self-pay

## 2014-03-30 ENCOUNTER — Ambulatory Visit (HOSPITAL_COMMUNITY)
Admission: RE | Admit: 2014-03-30 | Discharge: 2014-03-30 | Disposition: A | Discharge status: Home / Self Care | Payer: Self-pay | Source: Ambulatory Visit | Attending: Oncology | Admitting: Oncology

## 2014-03-30 ENCOUNTER — Encounter (HOSPITAL_COMMUNITY): Payer: Self-pay | Admitting: Pharmacy Technician

## 2014-03-30 ENCOUNTER — Telehealth: Payer: Self-pay | Admitting: *Deleted

## 2014-03-30 DIAGNOSIS — C6290 Malignant neoplasm of unspecified testis, unspecified whether descended or undescended: Secondary | ICD-10-CM | POA: Insufficient documentation

## 2014-03-30 DIAGNOSIS — F17200 Nicotine dependence, unspecified, uncomplicated: Secondary | ICD-10-CM | POA: Insufficient documentation

## 2014-03-30 DIAGNOSIS — C6291 Malignant neoplasm of right testis, unspecified whether descended or undescended: Secondary | ICD-10-CM

## 2014-03-30 DIAGNOSIS — C779 Secondary and unspecified malignant neoplasm of lymph node, unspecified: Secondary | ICD-10-CM | POA: Insufficient documentation

## 2014-03-30 LAB — CBC WITH DIFFERENTIAL/PLATELET
Basophils Absolute: 0.2 10*3/uL — ABNORMAL HIGH (ref 0.0–0.1)
Basophils Relative: 3 % — ABNORMAL HIGH (ref 0–1)
EOS PCT: 1 % (ref 0–5)
Eosinophils Absolute: 0.1 10*3/uL (ref 0.0–0.7)
HCT: 34.3 % — ABNORMAL LOW (ref 39.0–52.0)
Hemoglobin: 11.3 g/dL — ABNORMAL LOW (ref 13.0–17.0)
Lymphocytes Relative: 36 % (ref 12–46)
Lymphs Abs: 2.8 10*3/uL (ref 0.7–4.0)
MCH: 26.8 pg (ref 26.0–34.0)
MCHC: 32.9 g/dL (ref 30.0–36.0)
MCV: 81.3 fL (ref 78.0–100.0)
Monocytes Absolute: 1.2 10*3/uL — ABNORMAL HIGH (ref 0.1–1.0)
Monocytes Relative: 15 % — ABNORMAL HIGH (ref 3–12)
NEUTROS ABS: 3.6 10*3/uL (ref 1.7–7.7)
Neutrophils Relative %: 45 % (ref 43–77)
Platelets: 175 10*3/uL (ref 150–400)
RBC: 4.22 MIL/uL (ref 4.22–5.81)
RDW: 12.7 % (ref 11.5–15.5)
WBC: 7.9 10*3/uL (ref 4.0–10.5)

## 2014-03-30 LAB — APTT: aPTT: 38 seconds — ABNORMAL HIGH (ref 24–37)

## 2014-03-30 LAB — PROTIME-INR
INR: 0.93 (ref 0.00–1.49)
PROTHROMBIN TIME: 12.5 s (ref 11.6–15.2)

## 2014-03-30 MED ORDER — LIDOCAINE HCL 1 % IJ SOLN
INTRAMUSCULAR | Status: AC
  Filled 2014-03-30: qty 20

## 2014-03-30 MED ORDER — CEFAZOLIN SODIUM-DEXTROSE 2-3 GM-% IV SOLR
2.0000 g | Freq: Once | INTRAVENOUS | Status: AC
  Administered 2014-03-30: 2 g via INTRAVENOUS

## 2014-03-30 MED ORDER — LIDOCAINE-PRILOCAINE 2.5-2.5 % EX CREA: 1.0000 "application " | TOPICAL_CREAM | CUTANEOUS | Status: DC | PRN

## 2014-03-30 MED ORDER — FENTANYL CITRATE 0.05 MG/ML IJ SOLN
INTRAMUSCULAR | Status: AC
  Filled 2014-03-30: qty 4

## 2014-03-30 MED ORDER — MIDAZOLAM HCL 2 MG/2ML IJ SOLN
INTRAMUSCULAR | Status: AC | PRN
  Administered 2014-03-30 (×2): 1 mg via INTRAVENOUS
  Administered 2014-03-30: 2 mg via INTRAVENOUS

## 2014-03-30 MED ORDER — CEFAZOLIN SODIUM-DEXTROSE 2-3 GM-% IV SOLR
INTRAVENOUS | Status: AC
  Filled 2014-03-30: qty 50

## 2014-03-30 MED ORDER — FENTANYL CITRATE 0.05 MG/ML IJ SOLN
INTRAMUSCULAR | Status: AC | PRN
  Administered 2014-03-30: 100 ug via INTRAVENOUS
  Administered 2014-03-30 (×2): 50 ug via INTRAVENOUS

## 2014-03-30 MED ORDER — MIDAZOLAM HCL 2 MG/2ML IJ SOLN
INTRAMUSCULAR | Status: AC
  Filled 2014-03-30: qty 4

## 2014-03-30 MED ORDER — HEPARIN SOD (PORK) LOCK FLUSH 100 UNIT/ML IV SOLN
500.0000 [IU] | Freq: Once | INTRAVENOUS | Status: AC
  Administered 2014-03-30: 500 [IU] via INTRAVENOUS

## 2014-03-30 MED ORDER — LIDOCAINE-EPINEPHRINE (PF) 2 %-1:200000 IJ SOLN
INTRAMUSCULAR | Status: AC
  Filled 2014-03-30: qty 20

## 2014-03-30 MED ORDER — SODIUM CHLORIDE 0.9 % IV SOLN
INTRAVENOUS | Status: DC
  Administered 2014-03-30: 08:00:00 via INTRAVENOUS

## 2014-03-30 MED ORDER — HEPARIN SOD (PORK) LOCK FLUSH 100 UNIT/ML IV SOLN
INTRAVENOUS | Status: AC
  Filled 2014-03-30: qty 5

## 2014-03-30 NOTE — Discharge Instructions (Signed)

## 2014-03-30 NOTE — Telephone Encounter (Signed)
emla cream e-scribed to [facility], in [place]. Patient having port placed today.

## 2014-03-30 NOTE — H&P (Signed)
Jeremy Clay is an 33 y.o. male.   Chief Complaint: "I'm getting a port a cath" HPI: Patient with history of stage IIB testicular cancer presents today for port a cath placement for chemotherapy.  Past Medical History  Diagnosis Date  . Herniated disc   . Testicular mass     right  . 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     Past Surgical History  Procedure Laterality Date  . Orchiectomy Right 02/07/2014    Procedure: RIGHT RADICAL ORCHIECTOMY;  Surgeon: Malka So, MD;  Location: [facility];  Service: Urology;  Laterality: Right;    Family History  Problem Relation Age of Onset  . Diabetes Other   . Heart failure Other    Social History:  reports that he has been smoking Cigarettes.  He has a 6.5 pack-year smoking history. He has never used smokeless tobacco. He reports that he uses illicit drugs. He reports that he does not drink alcohol.  Allergies:  Allergies  Allergen Reactions  . Tegaderm Ag Mesh [Silver] Rash    Redness & irritation noted from IV site dressing 24 hrs after d/c'd     Current outpatient prescriptions:cephALEXin (KEFLEX) 500 MG capsule, Take 1 capsule (500 mg total) by mouth 2 (two) times daily., Disp: 14 capsule, Rfl: 0;  ibuprofen (ADVIL,MOTRIN) 200 MG tablet, Take 600 mg by mouth every 6 (six) hours as needed for moderate pain., Disp: , Rfl: ;  prochlorperazine (COMPAZINE) 10 MG tablet, Take 1 tablet (10 mg total) by mouth every 6 (six) hours as needed for nausea or vomiting., Disp: 30 tablet, Rfl: 0 Current facility-administered medications:0.9 %  sodium chloride infusion, , Intravenous, Continuous, Ascencion Dike, PA-C, Last Rate: 50 mL/hr at 03/30/14 0751;  ceFAZolin (ANCEF) 2-3 GM-% IVPB SOLR, , , , ;  ceFAZolin (ANCEF) IVPB 2 g/50 mL premix, 2 g, Intravenous, Once, [employer], PA-C;  fentaNYL (SUBLIMAZE) 0.05 MG/ML injection, , , , ;  midazolam (VERSED) 2  MG/2ML injection, , , ,    Results for orders placed during the hospital encounter of 03/30/14 (from the past 48 hour(s))  APTT     Status: Abnormal   Collection Time    03/30/14  7:28 AM      Result Value Ref Range   aPTT 38 (*) 24 - 37 seconds   Comment:            IF BASELINE aPTT IS ELEVATED,     SUGGEST PATIENT RISK ASSESSMENT     BE USED TO DETERMINE APPROPRIATE     ANTICOAGULANT THERAPY.  CBC WITH DIFFERENTIAL     Status: Abnormal (Preliminary result)   Collection Time    03/30/14  7:28 AM      Result Value Ref Range   WBC 7.9  4.0 - 10.5 K/uL   RBC 4.22  4.22 - 5.81 MIL/uL   Hemoglobin 11.3 (*) 13.0 - 17.0 g/dL   HCT 34.3 (*) 39.0 - 52.0 %   MCV 81.3  78.0 - 100.0 fL   MCH 26.8  26.0 - 34.0 pg   MCHC 32.9  30.0 - 36.0 g/dL   RDW 12.7  11.5 - 15.5 %   Platelets 175  150 - 400 K/uL   Neutrophils Relative % PENDING  43 - 77 %   Neutro Abs PENDING  1.7 - 7.7 K/uL   Band Neutrophils PENDING  0 - 10 %  Lymphocytes Relative PENDING  12 - 46 %   Lymphs Abs PENDING  0.7 - 4.0 K/uL   Monocytes Relative PENDING  3 - 12 %   Monocytes Absolute PENDING  0.1 - 1.0 K/uL   Eosinophils Relative PENDING  0 - 5 %   Eosinophils Absolute PENDING  0.0 - 0.7 K/uL   Basophils Relative PENDING  0 - 1 %   Basophils Absolute PENDING  0.0 - 0.1 K/uL   WBC Morphology PENDING     RBC Morphology PENDING     Smear Review PENDING     nRBC PENDING  0 /100 WBC   Metamyelocytes Relative PENDING     Myelocytes PENDING     Promyelocytes Absolute PENDING     Blasts PENDING    PROTIME-INR     Status: None   Collection Time    03/30/14  7:28 AM      Result Value Ref Range   Prothrombin Time 12.5  11.6 - 15.2 seconds   INR 0.93  0.00 - 1.49   No results found.  Review of Systems  Constitutional: Negative for fever and chills.  Respiratory: Negative for cough, hemoptysis and shortness of breath.   Cardiovascular: Negative for chest pain.  Gastrointestinal: Negative for nausea, vomiting,  abdominal pain and blood in stool.  Genitourinary: Negative for hematuria.  Musculoskeletal: Positive for back pain.  Neurological: Negative for headaches.  Endo/Heme/Allergies: Does not bruise/bleed easily.  Psychiatric/Behavioral: The patient is nervous/anxious.     Blood pressure 129/86, pulse 85, temperature 97.6 F (36.4 C), temperature source Oral, resp. rate 20, height 5\' 10"  (1.778 m), weight 177 lb 9.6 oz (80.559 kg), SpO2 98.00%. Physical Exam  Constitutional: He is oriented to person, place, and time. He appears well-developed and well-nourished.  Cardiovascular: Normal rate and regular rhythm.   Respiratory: Effort normal and breath sounds normal.  GI: Soft. Bowel sounds are normal. There is no tenderness.  Scar rt inguinal region (orchiectomy site) with mild erythema, sl tender  Musculoskeletal: Normal range of motion. He exhibits no edema.  Neurological: He is oriented to person, place, and time.     Assessment/Plan Patient with history of stage IIB testicular cancer presents today for port a cath placement for chemotherapy. Details/risks of procedure d/w pt/family with their understanding and consent.  ALLRED,D KEVIN 03/30/2014, 8:26 AM

## 2014-03-30 NOTE — Procedures (Signed)
Successful RT IJ POWER PORT TIP SVC/RA NO COMP STABLE FULL REPORT IN PACS  

## 2014-04-03 ENCOUNTER — Encounter: Payer: Self-pay | Admitting: Oncology

## 2014-04-03 ENCOUNTER — Other Ambulatory Visit (HOSPITAL_BASED_OUTPATIENT_CLINIC_OR_DEPARTMENT_OTHER): Payer: Self-pay

## 2014-04-03 ENCOUNTER — Ambulatory Visit (HOSPITAL_BASED_OUTPATIENT_CLINIC_OR_DEPARTMENT_OTHER): Payer: Self-pay

## 2014-04-03 VITALS — BP 125/85 | HR 80 | Temp 96.7°F | Resp 19

## 2014-04-03 DIAGNOSIS — C6291 Malignant neoplasm of right testis, unspecified whether descended or undescended: Secondary | ICD-10-CM

## 2014-04-03 DIAGNOSIS — C6290 Malignant neoplasm of unspecified testis, unspecified whether descended or undescended: Secondary | ICD-10-CM

## 2014-04-03 DIAGNOSIS — Z5111 Encounter for antineoplastic chemotherapy: Secondary | ICD-10-CM

## 2014-04-03 LAB — COMPREHENSIVE METABOLIC PANEL (CC13)
ALBUMIN: 3.1 g/dL — AB (ref 3.5–5.0)
ALK PHOS: 71 U/L (ref 40–150)
ALT: 49 U/L (ref 0–55)
ANION GAP: 8 meq/L (ref 3–11)
AST: 23 U/L (ref 5–34)
BUN: 5.3 mg/dL — AB (ref 7.0–26.0)
CALCIUM: 9.2 mg/dL (ref 8.4–10.4)
CO2: 27 mEq/L (ref 22–29)
Chloride: 103 mEq/L (ref 98–109)
Creatinine: 0.7 mg/dL (ref 0.7–1.3)
GLUCOSE: 96 mg/dL (ref 70–140)
POTASSIUM: 4 meq/L (ref 3.5–5.1)
Sodium: 138 mEq/L (ref 136–145)
Total Bilirubin: <0.2 mg/dL (ref 0.20–1.20)
Total Protein: 7.5 g/dL (ref 6.4–8.3)

## 2014-04-03 LAB — CBC WITH DIFFERENTIAL/PLATELET
BASO%: 0.7 % (ref 0.0–2.0)
BASOS ABS: 0.1 10*3/uL (ref 0.0–0.1)
EOS ABS: 0 10*3/uL (ref 0.0–0.5)
EOS%: 0.1 % (ref 0.0–7.0)
HCT: 34.4 % — ABNORMAL LOW (ref 38.4–49.9)
HEMOGLOBIN: 11.6 g/dL — AB (ref 13.0–17.1)
LYMPH%: 25.5 % (ref 14.0–49.0)
MCH: 27.5 pg (ref 27.2–33.4)
MCHC: 33.8 g/dL (ref 32.0–36.0)
MCV: 81.3 fL (ref 79.3–98.0)
MONO#: 1.1 10*3/uL — ABNORMAL HIGH (ref 0.1–0.9)
MONO%: 12.3 % (ref 0.0–14.0)
NEUT%: 61.4 % (ref 39.0–75.0)
NEUTROS ABS: 5.7 10*3/uL (ref 1.5–6.5)
PLATELETS: 283 10*3/uL (ref 140–400)
RBC: 4.23 10*6/uL (ref 4.20–5.82)
RDW: 13.5 % (ref 11.0–14.6)
WBC: 9.3 10*3/uL (ref 4.0–10.3)
lymph#: 2.4 10*3/uL (ref 0.9–3.3)

## 2014-04-03 LAB — TECHNOLOGIST REVIEW

## 2014-04-03 MED ORDER — SODIUM CHLORIDE 0.9 % IV SOLN
20.0000 mg/m2 | Freq: Once | INTRAVENOUS | Status: AC
  Administered 2014-04-03: 40 mg via INTRAVENOUS
  Filled 2014-04-03: qty 40

## 2014-04-03 MED ORDER — ETOPOSIDE CHEMO INJECTION 1 GM/50ML
100.0000 mg/m2 | Freq: Once | INTRAVENOUS | Status: AC
  Administered 2014-04-03: 200 mg via INTRAVENOUS
  Filled 2014-04-03: qty 10

## 2014-04-03 MED ORDER — DEXAMETHASONE SODIUM PHOSPHATE 20 MG/5ML IJ SOLN
INTRAMUSCULAR | Status: AC
  Filled 2014-04-03: qty 5

## 2014-04-03 MED ORDER — SODIUM CHLORIDE 0.9 % IV SOLN
150.0000 mg | Freq: Once | INTRAVENOUS | Status: AC
  Administered 2014-04-03: 150 mg via INTRAVENOUS
  Filled 2014-04-03: qty 5

## 2014-04-03 MED ORDER — PALONOSETRON HCL INJECTION 0.25 MG/5ML
INTRAVENOUS | Status: AC
  Filled 2014-04-03: qty 5

## 2014-04-03 MED ORDER — POTASSIUM CHLORIDE 2 MEQ/ML IV SOLN
Freq: Once | INTRAVENOUS | Status: AC
  Administered 2014-04-03: 10:00:00 via INTRAVENOUS
  Filled 2014-04-03: qty 10

## 2014-04-03 MED ORDER — SODIUM CHLORIDE 0.9 % IV SOLN
Freq: Once | INTRAVENOUS | Status: AC
  Administered 2014-04-03: 10:00:00 via INTRAVENOUS

## 2014-04-03 MED ORDER — HEPARIN SOD (PORK) LOCK FLUSH 100 UNIT/ML IV SOLN
500.0000 [IU] | Freq: Once | INTRAVENOUS | Status: AC | PRN
  Administered 2014-04-03: 500 [IU]
  Filled 2014-04-03: qty 5

## 2014-04-03 MED ORDER — PALONOSETRON HCL INJECTION 0.25 MG/5ML
0.2500 mg | Freq: Once | INTRAVENOUS | Status: AC
  Administered 2014-04-03: 0.25 mg via INTRAVENOUS

## 2014-04-03 MED ORDER — DEXAMETHASONE SODIUM PHOSPHATE 20 MG/5ML IJ SOLN
12.0000 mg | Freq: Once | INTRAMUSCULAR | Status: AC
  Administered 2014-04-03: 12 mg via INTRAVENOUS

## 2014-04-03 MED ORDER — SODIUM CHLORIDE 0.9 % IJ SOLN
10.0000 mL | INTRAMUSCULAR | Status: DC | PRN
  Administered 2014-04-03: 10 mL
  Filled 2014-04-03: qty 10

## 2014-04-03 NOTE — Patient Instructions (Signed)
[facility] Discharge Instructions for Patients Receiving Chemotherapy  Today you received the following chemotherapy agents: Cisplatin, Etoposide  To help prevent nausea and vomiting after your treatment, we encourage you to take your nausea medication as prescribed by your physician.   If you develop nausea and vomiting that is not controlled by your nausea medication, call the clinic.   BELOW ARE SYMPTOMS THAT SHOULD BE REPORTED IMMEDIATELY:  *FEVER GREATER THAN 100.5 F  *CHILLS WITH OR WITHOUT FEVER  NAUSEA AND VOMITING THAT IS NOT CONTROLLED WITH YOUR NAUSEA MEDICATION  *UNUSUAL SHORTNESS OF BREATH  *UNUSUAL BRUISING OR BLEEDING  TENDERNESS IN MOUTH AND THROAT WITH OR WITHOUT PRESENCE OF ULCERS  *URINARY PROBLEMS  *BOWEL PROBLEMS  UNUSUAL RASH Items with * indicate a potential emergency and should be followed up as soon as possible.  Feel free to call the clinic you have any questions or concerns. The clinic phone number is [phone].    

## 2014-04-03 NOTE — Progress Notes (Signed)
Patient said he did apply for medicaid last week online. He said no case worker yet.

## 2014-04-04 ENCOUNTER — Ambulatory Visit (HOSPITAL_BASED_OUTPATIENT_CLINIC_OR_DEPARTMENT_OTHER): Payer: Self-pay

## 2014-04-04 VITALS — BP 133/84 | HR 84 | Temp 98.6°F | Resp 18

## 2014-04-04 DIAGNOSIS — Z5111 Encounter for antineoplastic chemotherapy: Secondary | ICD-10-CM

## 2014-04-04 DIAGNOSIS — C6290 Malignant neoplasm of unspecified testis, unspecified whether descended or undescended: Secondary | ICD-10-CM

## 2014-04-04 DIAGNOSIS — C6291 Malignant neoplasm of right testis, unspecified whether descended or undescended: Secondary | ICD-10-CM

## 2014-04-04 MED ORDER — SODIUM CHLORIDE 0.9 % IJ SOLN
10.0000 mL | INTRAMUSCULAR | Status: DC | PRN
  Administered 2014-04-04: 10 mL
  Filled 2014-04-04: qty 10

## 2014-04-04 MED ORDER — DEXAMETHASONE SODIUM PHOSPHATE 20 MG/5ML IJ SOLN
20.0000 mg | Freq: Once | INTRAMUSCULAR | Status: AC
  Administered 2014-04-04: 20 mg via INTRAVENOUS

## 2014-04-04 MED ORDER — HEPARIN SOD (PORK) LOCK FLUSH 100 UNIT/ML IV SOLN
500.0000 [IU] | Freq: Once | INTRAVENOUS | Status: AC | PRN
  Administered 2014-04-04: 500 [IU]
  Filled 2014-04-04: qty 5

## 2014-04-04 MED ORDER — SODIUM CHLORIDE 0.9 % IV SOLN
20.0000 mg/m2 | Freq: Once | INTRAVENOUS | Status: AC
  Administered 2014-04-04: 40 mg via INTRAVENOUS
  Filled 2014-04-04: qty 40

## 2014-04-04 MED ORDER — SODIUM CHLORIDE 0.9 % IV SOLN
100.0000 mg/m2 | Freq: Once | INTRAVENOUS | Status: AC
  Administered 2014-04-04: 200 mg via INTRAVENOUS
  Filled 2014-04-04: qty 10

## 2014-04-04 MED ORDER — SODIUM CHLORIDE 0.9 % IV SOLN
30.0000 [IU] | Freq: Once | INTRAVENOUS | Status: AC
  Administered 2014-04-04: 30 [IU] via INTRAVENOUS
  Filled 2014-04-04: qty 10

## 2014-04-04 MED ORDER — POTASSIUM CHLORIDE 2 MEQ/ML IV SOLN
Freq: Once | INTRAVENOUS | Status: AC
  Administered 2014-04-04: 10:00:00 via INTRAVENOUS
  Filled 2014-04-04: qty 10

## 2014-04-04 MED ORDER — SODIUM CHLORIDE 0.9 % IV SOLN
Freq: Once | INTRAVENOUS | Status: AC
  Administered 2014-04-04: 10:00:00 via INTRAVENOUS

## 2014-04-04 MED ORDER — DEXAMETHASONE SODIUM PHOSPHATE 20 MG/5ML IJ SOLN
INTRAMUSCULAR | Status: AC
  Filled 2014-04-04: qty 5

## 2014-04-04 NOTE — Patient Instructions (Signed)
[facility] Discharge Instructions for Patients Receiving Chemotherapy  Today you received the following chemotherapy agents :  Cisplatin, Bleomycin, Etoposide.  To help prevent nausea and vomiting after your treatment, we encourage you to take your nausea medication as prescribed by your physician.   If you develop nausea and vomiting that is not controlled by your nausea medication, call the clinic.   BELOW ARE SYMPTOMS THAT SHOULD BE REPORTED IMMEDIATELY:  *FEVER GREATER THAN 100.5 F  *CHILLS WITH OR WITHOUT FEVER  NAUSEA AND VOMITING THAT IS NOT CONTROLLED WITH YOUR NAUSEA MEDICATION  *UNUSUAL SHORTNESS OF BREATH  *UNUSUAL BRUISING OR BLEEDING  TENDERNESS IN MOUTH AND THROAT WITH OR WITHOUT PRESENCE OF ULCERS  *URINARY PROBLEMS  *BOWEL PROBLEMS  UNUSUAL RASH Items with * indicate a potential emergency and should be followed up as soon as possible.  Feel free to call the clinic you have any questions or concerns. The clinic phone number is [phone].

## 2014-04-04 NOTE — Progress Notes (Signed)
Pt  Voided   1375 cc pre  CDDP ;  Voided   700 cc  During  CDDP.

## 2014-04-05 ENCOUNTER — Ambulatory Visit (HOSPITAL_BASED_OUTPATIENT_CLINIC_OR_DEPARTMENT_OTHER): Payer: Self-pay

## 2014-04-05 VITALS — BP 146/83 | HR 79 | Temp 97.0°F | Resp 19

## 2014-04-05 DIAGNOSIS — C6290 Malignant neoplasm of unspecified testis, unspecified whether descended or undescended: Secondary | ICD-10-CM

## 2014-04-05 DIAGNOSIS — C6291 Malignant neoplasm of right testis, unspecified whether descended or undescended: Secondary | ICD-10-CM

## 2014-04-05 DIAGNOSIS — Z5111 Encounter for antineoplastic chemotherapy: Secondary | ICD-10-CM

## 2014-04-05 MED ORDER — SODIUM CHLORIDE 0.9 % IJ SOLN
10.0000 mL | INTRAMUSCULAR | Status: DC | PRN
  Administered 2014-04-05: 10 mL
  Filled 2014-04-05: qty 10

## 2014-04-05 MED ORDER — SODIUM CHLORIDE 0.9 % IV SOLN
20.0000 mg/m2 | Freq: Once | INTRAVENOUS | Status: AC
  Administered 2014-04-05: 40 mg via INTRAVENOUS
  Filled 2014-04-05: qty 40

## 2014-04-05 MED ORDER — DEXTROSE-NACL 5-0.45 % IV SOLN
Freq: Once | INTRAVENOUS | Status: AC
  Administered 2014-04-05: 10:00:00 via INTRAVENOUS
  Filled 2014-04-05: qty 10

## 2014-04-05 MED ORDER — SODIUM CHLORIDE 0.9 % IV SOLN
Freq: Once | INTRAVENOUS | Status: AC
  Administered 2014-04-05: 10:00:00 via INTRAVENOUS

## 2014-04-05 MED ORDER — HEPARIN SOD (PORK) LOCK FLUSH 100 UNIT/ML IV SOLN
500.0000 [IU] | Freq: Once | INTRAVENOUS | Status: AC | PRN
  Administered 2014-04-05: 500 [IU]
  Filled 2014-04-05: qty 5

## 2014-04-05 MED ORDER — PALONOSETRON HCL INJECTION 0.25 MG/5ML
0.2500 mg | Freq: Once | INTRAVENOUS | Status: AC
  Administered 2014-04-05: 0.25 mg via INTRAVENOUS

## 2014-04-05 MED ORDER — PALONOSETRON HCL INJECTION 0.25 MG/5ML
INTRAVENOUS | Status: AC
  Filled 2014-04-05: qty 5

## 2014-04-05 MED ORDER — DEXAMETHASONE SODIUM PHOSPHATE 20 MG/5ML IJ SOLN
INTRAMUSCULAR | Status: AC
  Filled 2014-04-05: qty 5

## 2014-04-05 MED ORDER — DEXAMETHASONE SODIUM PHOSPHATE 20 MG/5ML IJ SOLN
20.0000 mg | Freq: Once | INTRAMUSCULAR | Status: AC
  Administered 2014-04-05: 20 mg via INTRAVENOUS

## 2014-04-05 MED ORDER — SODIUM CHLORIDE 0.9 % IV SOLN
100.0000 mg/m2 | Freq: Once | INTRAVENOUS | Status: AC
  Administered 2014-04-05: 200 mg via INTRAVENOUS
  Filled 2014-04-05: qty 10

## 2014-04-05 NOTE — Patient Instructions (Signed)
[facility] Discharge Instructions for Patients Receiving Chemotherapy  Today you received the following chemotherapy agents: Cisplatin, Etoposide  To help prevent nausea and vomiting after your treatment, we encourage you to take your nausea medication as prescribed by your physician.   If you develop nausea and vomiting that is not controlled by your nausea medication, call the clinic.   BELOW ARE SYMPTOMS THAT SHOULD BE REPORTED IMMEDIATELY:  *FEVER GREATER THAN 100.5 F  *CHILLS WITH OR WITHOUT FEVER  NAUSEA AND VOMITING THAT IS NOT CONTROLLED WITH YOUR NAUSEA MEDICATION  *UNUSUAL SHORTNESS OF BREATH  *UNUSUAL BRUISING OR BLEEDING  TENDERNESS IN MOUTH AND THROAT WITH OR WITHOUT PRESENCE OF ULCERS  *URINARY PROBLEMS  *BOWEL PROBLEMS  UNUSUAL RASH Items with * indicate a potential emergency and should be followed up as soon as possible.  Feel free to call the clinic you have any questions or concerns. The clinic phone number is [phone].    

## 2014-04-06 ENCOUNTER — Ambulatory Visit (HOSPITAL_BASED_OUTPATIENT_CLINIC_OR_DEPARTMENT_OTHER): Payer: Self-pay

## 2014-04-06 VITALS — BP 151/95 | HR 73 | Temp 97.6°F

## 2014-04-06 DIAGNOSIS — C6290 Malignant neoplasm of unspecified testis, unspecified whether descended or undescended: Secondary | ICD-10-CM

## 2014-04-06 DIAGNOSIS — C6291 Malignant neoplasm of right testis, unspecified whether descended or undescended: Secondary | ICD-10-CM

## 2014-04-06 DIAGNOSIS — Z5111 Encounter for antineoplastic chemotherapy: Secondary | ICD-10-CM

## 2014-04-06 MED ORDER — SODIUM CHLORIDE 0.9 % IV SOLN
Freq: Once | INTRAVENOUS | Status: AC
  Administered 2014-04-06: 10:00:00 via INTRAVENOUS

## 2014-04-06 MED ORDER — CISPLATIN CHEMO INJECTION 100MG/100ML
20.0000 mg/m2 | Freq: Once | INTRAVENOUS | Status: AC
  Administered 2014-04-06: 40 mg via INTRAVENOUS
  Filled 2014-04-06: qty 40

## 2014-04-06 MED ORDER — DEXAMETHASONE SODIUM PHOSPHATE 20 MG/5ML IJ SOLN
20.0000 mg | Freq: Once | INTRAMUSCULAR | Status: AC
  Administered 2014-04-06: 20 mg via INTRAVENOUS

## 2014-04-06 MED ORDER — POTASSIUM CHLORIDE 2 MEQ/ML IV SOLN
Freq: Once | INTRAVENOUS | Status: AC
  Administered 2014-04-06: 10:00:00 via INTRAVENOUS
  Filled 2014-04-06: qty 10

## 2014-04-06 MED ORDER — SODIUM CHLORIDE 0.9 % IV SOLN
100.0000 mg/m2 | Freq: Once | INTRAVENOUS | Status: AC
  Administered 2014-04-06: 200 mg via INTRAVENOUS
  Filled 2014-04-06: qty 10

## 2014-04-06 MED ORDER — DEXAMETHASONE SODIUM PHOSPHATE 20 MG/5ML IJ SOLN
INTRAMUSCULAR | Status: AC
  Filled 2014-04-06: qty 5

## 2014-04-06 MED ORDER — HEPARIN SOD (PORK) LOCK FLUSH 100 UNIT/ML IV SOLN
500.0000 [IU] | Freq: Once | INTRAVENOUS | Status: AC | PRN
  Administered 2014-04-06: 500 [IU]
  Filled 2014-04-06: qty 5

## 2014-04-06 MED ORDER — SODIUM CHLORIDE 0.9 % IJ SOLN
10.0000 mL | INTRAMUSCULAR | Status: DC | PRN
  Administered 2014-04-06: 10 mL
  Filled 2014-04-06: qty 10

## 2014-04-06 NOTE — Patient Instructions (Signed)
[facility] Discharge Instructions for Patients Receiving Chemotherapy  Today you received the following chemotherapy agents Cisplatin/VP 16 To help prevent nausea and vomiting after your treatment, we encourage you to take your nausea medication as prescribed.   If you develop nausea and vomiting that is not controlled by your nausea medication, call the clinic.   BELOW ARE SYMPTOMS THAT SHOULD BE REPORTED IMMEDIATELY:  *FEVER GREATER THAN 100.5 F  *CHILLS WITH OR WITHOUT FEVER  NAUSEA AND VOMITING THAT IS NOT CONTROLLED WITH YOUR NAUSEA MEDICATION  *UNUSUAL SHORTNESS OF BREATH  *UNUSUAL BRUISING OR BLEEDING  TENDERNESS IN MOUTH AND THROAT WITH OR WITHOUT PRESENCE OF ULCERS  *URINARY PROBLEMS  *BOWEL PROBLEMS  UNUSUAL RASH Items with * indicate a potential emergency and should be followed up as soon as possible.  Feel free to call the clinic you have any questions or concerns. The clinic phone number is [phone].

## 2014-04-07 ENCOUNTER — Ambulatory Visit (HOSPITAL_BASED_OUTPATIENT_CLINIC_OR_DEPARTMENT_OTHER): Payer: Self-pay

## 2014-04-07 VITALS — BP 141/94 | HR 79 | Temp 97.8°F

## 2014-04-07 DIAGNOSIS — Z5111 Encounter for antineoplastic chemotherapy: Secondary | ICD-10-CM

## 2014-04-07 DIAGNOSIS — C6290 Malignant neoplasm of unspecified testis, unspecified whether descended or undescended: Secondary | ICD-10-CM

## 2014-04-07 DIAGNOSIS — C6291 Malignant neoplasm of right testis, unspecified whether descended or undescended: Secondary | ICD-10-CM

## 2014-04-07 MED ORDER — SODIUM CHLORIDE 0.9 % IJ SOLN
10.0000 mL | INTRAMUSCULAR | Status: DC | PRN
  Administered 2014-04-07: 10 mL
  Filled 2014-04-07: qty 10

## 2014-04-07 MED ORDER — PALONOSETRON HCL INJECTION 0.25 MG/5ML
0.2500 mg | Freq: Once | INTRAVENOUS | Status: AC
  Administered 2014-04-07: 0.25 mg via INTRAVENOUS

## 2014-04-07 MED ORDER — DEXAMETHASONE SODIUM PHOSPHATE 20 MG/5ML IJ SOLN
INTRAMUSCULAR | Status: AC
  Filled 2014-04-07: qty 5

## 2014-04-07 MED ORDER — POTASSIUM CHLORIDE 2 MEQ/ML IV SOLN
Freq: Once | INTRAVENOUS | Status: AC
  Administered 2014-04-07: 10:00:00 via INTRAVENOUS
  Filled 2014-04-07: qty 10

## 2014-04-07 MED ORDER — SODIUM CHLORIDE 0.9 % IV SOLN
20.0000 mg/m2 | Freq: Once | INTRAVENOUS | Status: AC
  Administered 2014-04-07: 40 mg via INTRAVENOUS
  Filled 2014-04-07: qty 40

## 2014-04-07 MED ORDER — SODIUM CHLORIDE 0.9 % IV SOLN
100.0000 mg/m2 | Freq: Once | INTRAVENOUS | Status: AC
  Administered 2014-04-07: 200 mg via INTRAVENOUS
  Filled 2014-04-07: qty 10

## 2014-04-07 MED ORDER — HEPARIN SOD (PORK) LOCK FLUSH 100 UNIT/ML IV SOLN
500.0000 [IU] | Freq: Once | INTRAVENOUS | Status: AC | PRN
  Administered 2014-04-07: 500 [IU]
  Filled 2014-04-07: qty 5

## 2014-04-07 MED ORDER — DEXAMETHASONE SODIUM PHOSPHATE 20 MG/5ML IJ SOLN
20.0000 mg | Freq: Once | INTRAMUSCULAR | Status: AC
  Administered 2014-04-07: 20 mg via INTRAVENOUS

## 2014-04-07 MED ORDER — PALONOSETRON HCL INJECTION 0.25 MG/5ML
INTRAVENOUS | Status: AC
  Filled 2014-04-07: qty 5

## 2014-04-07 MED ORDER — SODIUM CHLORIDE 0.9 % IV SOLN
Freq: Once | INTRAVENOUS | Status: AC
  Administered 2014-04-07: 10:00:00 via INTRAVENOUS

## 2014-04-07 NOTE — Patient Instructions (Signed)
[facility] Discharge Instructions for Patients Receiving Chemotherapy  Today you received the following chemotherapy agents: Cisplatin and Etoposide.  To help prevent nausea and vomiting after your treatment, we encourage you to take your nausea medicatio: Compazine 10 mg every 6 hours.   If you develop nausea and vomiting that is not controlled by your nausea medication, call the clinic.   BELOW ARE SYMPTOMS THAT SHOULD BE REPORTED IMMEDIATELY:  *FEVER GREATER THAN 100.5 F  *CHILLS WITH OR WITHOUT FEVER  NAUSEA AND VOMITING THAT IS NOT CONTROLLED WITH YOUR NAUSEA MEDICATION  *UNUSUAL SHORTNESS OF BREATH  *UNUSUAL BRUISING OR BLEEDING  TENDERNESS IN MOUTH AND THROAT WITH OR WITHOUT PRESENCE OF ULCERS  *URINARY PROBLEMS  *BOWEL PROBLEMS  UNUSUAL RASH Items with * indicate a potential emergency and should be followed up as soon as possible.  Feel free to call the clinic you have any questions or concerns. The clinic phone number is [phone].

## 2014-04-11 ENCOUNTER — Encounter: Payer: Self-pay | Admitting: Physician Assistant

## 2014-04-11 ENCOUNTER — Other Ambulatory Visit (HOSPITAL_BASED_OUTPATIENT_CLINIC_OR_DEPARTMENT_OTHER): Payer: Self-pay

## 2014-04-11 ENCOUNTER — Ambulatory Visit (HOSPITAL_BASED_OUTPATIENT_CLINIC_OR_DEPARTMENT_OTHER): Payer: Self-pay | Admitting: Physician Assistant

## 2014-04-11 ENCOUNTER — Ambulatory Visit (HOSPITAL_BASED_OUTPATIENT_CLINIC_OR_DEPARTMENT_OTHER): Payer: Self-pay

## 2014-04-11 VITALS — BP 107/79 | HR 93 | Temp 98.3°F | Resp 20 | Ht 70.0 in | Wt 174.6 lb

## 2014-04-11 DIAGNOSIS — L03314 Cellulitis of groin: Secondary | ICD-10-CM

## 2014-04-11 DIAGNOSIS — C6291 Malignant neoplasm of right testis, unspecified whether descended or undescended: Secondary | ICD-10-CM

## 2014-04-11 DIAGNOSIS — Z5111 Encounter for antineoplastic chemotherapy: Secondary | ICD-10-CM

## 2014-04-11 DIAGNOSIS — C6290 Malignant neoplasm of unspecified testis, unspecified whether descended or undescended: Secondary | ICD-10-CM

## 2014-04-11 DIAGNOSIS — L02219 Cutaneous abscess of trunk, unspecified: Secondary | ICD-10-CM

## 2014-04-11 DIAGNOSIS — L03319 Cellulitis of trunk, unspecified: Secondary | ICD-10-CM

## 2014-04-11 LAB — COMPREHENSIVE METABOLIC PANEL (CC13)
ALBUMIN: 3.4 g/dL — AB (ref 3.5–5.0)
ALK PHOS: 64 U/L (ref 40–150)
ALT: 34 U/L (ref 0–55)
AST: 24 U/L (ref 5–34)
Anion Gap: 7 mEq/L (ref 3–11)
BUN: 16.1 mg/dL (ref 7.0–26.0)
CO2: 26 mEq/L (ref 22–29)
Calcium: 9.2 mg/dL (ref 8.4–10.4)
Chloride: 101 mEq/L (ref 98–109)
Creatinine: 0.7 mg/dL (ref 0.7–1.3)
Glucose: 96 mg/dl (ref 70–140)
POTASSIUM: 4.3 meq/L (ref 3.5–5.1)
Sodium: 135 mEq/L — ABNORMAL LOW (ref 136–145)
TOTAL PROTEIN: 7.6 g/dL (ref 6.4–8.3)
Total Bilirubin: 0.27 mg/dL (ref 0.20–1.20)

## 2014-04-11 LAB — CBC WITH DIFFERENTIAL/PLATELET
BASO%: 0.4 % (ref 0.0–2.0)
Basophils Absolute: 0 10*3/uL (ref 0.0–0.1)
EOS ABS: 0 10*3/uL (ref 0.0–0.5)
EOS%: 0.1 % (ref 0.0–7.0)
HCT: 34.8 % — ABNORMAL LOW (ref 38.4–49.9)
HGB: 11.6 g/dL — ABNORMAL LOW (ref 13.0–17.1)
LYMPH%: 26.9 % (ref 14.0–49.0)
MCH: 27.1 pg — AB (ref 27.2–33.4)
MCHC: 33.3 g/dL (ref 32.0–36.0)
MCV: 81.2 fL (ref 79.3–98.0)
MONO#: 0 10*3/uL — ABNORMAL LOW (ref 0.1–0.9)
MONO%: 0.3 % (ref 0.0–14.0)
NEUT%: 72.3 % (ref 39.0–75.0)
NEUTROS ABS: 3.2 10*3/uL (ref 1.5–6.5)
Platelets: 334 10*3/uL (ref 140–400)
RBC: 4.28 10*6/uL (ref 4.20–5.82)
RDW: 13.5 % (ref 11.0–14.6)
WBC: 4.5 10*3/uL (ref 4.0–10.3)
lymph#: 1.2 10*3/uL (ref 0.9–3.3)

## 2014-04-11 MED ORDER — HEPARIN SOD (PORK) LOCK FLUSH 100 UNIT/ML IV SOLN
500.0000 [IU] | Freq: Once | INTRAVENOUS | Status: AC | PRN
  Administered 2014-04-11: 500 [IU]
  Filled 2014-04-11: qty 5

## 2014-04-11 MED ORDER — SODIUM CHLORIDE 0.9 % IV SOLN
30.0000 [IU] | Freq: Once | INTRAVENOUS | Status: AC
  Administered 2014-04-11: 30 [IU] via INTRAVENOUS
  Filled 2014-04-11: qty 10

## 2014-04-11 MED ORDER — PROCHLORPERAZINE MALEATE 10 MG PO TABS
ORAL_TABLET | ORAL | Status: AC
  Filled 2014-04-11: qty 1

## 2014-04-11 MED ORDER — SODIUM CHLORIDE 0.9 % IJ SOLN
10.0000 mL | INTRAMUSCULAR | Status: DC | PRN
  Administered 2014-04-11: 10 mL
  Filled 2014-04-11: qty 10

## 2014-04-11 MED ORDER — SODIUM CHLORIDE 0.9 % IV SOLN
Freq: Once | INTRAVENOUS | Status: AC
  Administered 2014-04-11: 10:00:00 via INTRAVENOUS

## 2014-04-11 MED ORDER — PROCHLORPERAZINE MALEATE 10 MG PO TABS
10.0000 mg | ORAL_TABLET | Freq: Once | ORAL | Status: AC
  Administered 2014-04-11: 10 mg via ORAL

## 2014-04-11 NOTE — Patient Instructions (Signed)
[place] Discharge Instructions for Patients Receiving Chemotherapy  Today you received the following chemotherapy agents Bleomycin.  To help prevent nausea and vomiting after your treatment, we encourage you to take your nausea medication as prescribed.   If you develop nausea and vomiting that is not controlled by your nausea medication, call the clinic.   BELOW ARE SYMPTOMS THAT SHOULD BE REPORTED IMMEDIATELY:  *FEVER GREATER THAN 100.5 F  *CHILLS WITH OR WITHOUT FEVER  NAUSEA AND VOMITING THAT IS NOT CONTROLLED WITH YOUR NAUSEA MEDICATION  *UNUSUAL SHORTNESS OF BREATH  *UNUSUAL BRUISING OR BLEEDING  TENDERNESS IN MOUTH AND THROAT WITH OR WITHOUT PRESENCE OF ULCERS  *URINARY PROBLEMS  *BOWEL PROBLEMS  UNUSUAL RASH Items with * indicate a potential emergency and should be followed up as soon as possible.  Feel free to call the clinic you have any questions or concerns. The clinic phone number is [phone].

## 2014-04-11 NOTE — Progress Notes (Signed)
Hematology and Oncology Follow Up Visit  Jeremy Clay [id number] 26-Nov-1980 33 y.o. 04/11/2014 9:25 AM   Principle Diagnosis: 33 year old gentleman with testicular cancer diagnosed in June of 2015. He presented with a large testicular mass and found to have a stage IIB disease with periaortic lymphadenopathy. 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   Prior Therapy: He is status post radical orchiectomy on 02/07/2014. The tumor measured 7.3 cm showed 10% embryonal and 80%,. He also 10% yolk sac tumor.  Current therapy: Status post 1 cycle of BEP chemotherapy. He presents today for day 9 of cycle #2.  Interim History:  Jeremy Clay presents today for a followup visit. He is tolerating systemic chemotherapy without any complications. He reported grade 1 nausea and vomiting as well as grade 1 diarrhea and constipation. All these symptoms have resolved. He had an incisional infection that was treated with Keflex. He completed his course of antibiotics. The infection was complicated with low-grade fever and chills. These symptoms have resolved. The incision is completely healed and he's had no further fever chills or  drainage. He reports his appetite is good and he is craving fruits and vegetables. His intake of junk food has decreased remarkably. He is able to perform his ADLs without difficulty. He does report decreased energy. He reports that his hair is thinning. For the most part tolerated chemotherapy without any serious complications. He does not report any chest pain or difficulty breathing. Did not report any cough or hemoptysis. Denies weight loss. Does not report any constipation or diarrhea or hematochezia. Is on current frequency urgency or hesitancy. Does not report any skeletal complaints. Is not reporting any lymphadenopathy or petechiae. Remaining review of systems unremarkable.  Medications: I have reviewed the patient's  current medications.  Current Outpatient Prescriptions  Medication Sig Dispense Refill  . ibuprofen (ADVIL,MOTRIN) 200 MG tablet Take 600 mg by mouth every 6 (six) hours as needed for moderate pain.      [name] prochlorperazine (COMPAZINE) 10 MG tablet Take 1 tablet (10 mg total) by mouth every 6 (six) hours as needed for nausea or vomiting.  30 tablet  0  . cephALEXin (KEFLEX) 500 MG capsule Take 1 capsule (500 mg total) by mouth 2 (two) times daily.  14 capsule  0  . lidocaine-prilocaine (EMLA) cream Apply 1 application topically as needed.  30 g  0   No current facility-administered medications for this visit.     Allergies:  Allergies  Allergen Reactions  . Tegaderm Ag Mesh [Silver] Rash    Redness & irritation noted from IV site dressing 24 hrs after d/c'd     Past Medical History, Surgical history, Social history, and Family History were reviewed and updated.   Physical Exam: Blood pressure 107/79, pulse 93, temperature 98.3 F (36.8 C), temperature source Oral, resp. rate 20, height 5\' 10"  (1.778 m), weight 174 lb 9.6 oz (79.198 kg), SpO2 100.00%. ECOG:  General appearance: alert and cooperative Head: Normocephalic, without obvious abnormality Neck: no adenopathy Lymph nodes: Cervical, supraclavicular, and axillary nodes normal. Heart:regular rate and rhythm, S1, S2 normal, no murmur, click, rub or gallop Lung:chest clear, no wheezing, rales, normal symmetric air entry Abdomin: soft, non-tender, without masses or organomegaly EXT:no erythema, induration, or nodules His incision site appeared dry and intact no erythema noted without any tenderness to touch. This is located in the right inguinal area  Lab Results: Lab Results  Component Value Date  WBC 4.5 04/11/2014   HGB 11.6* 04/11/2014   HCT 34.8* 04/11/2014   MCV 81.2 04/11/2014   PLT 334 04/11/2014     Chemistry      Component Value Date/Time   NA 138 04/03/2014 0939   K 4.0 04/03/2014 0939   CO2 27 04/03/2014 0939   BUN  5.3* 04/03/2014 0939   CREATININE 0.7 04/03/2014 0939      Component Value Date/Time   CALCIUM 9.2 04/03/2014 0939   ALKPHOS 71 04/03/2014 0939   AST 23 04/03/2014 0939   ALT 49 04/03/2014 0939   BILITOT <0.20 04/03/2014 0939         Impression and Plan:  33 year old gentleman with the following issues:  1. Testicular cancer diagnosed in June of 2015. He presented with a large right testicular mass and found to have a stage IIB disease. 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 He is receiving BEP chemotherapy without any complications. The plan is to proceed with day 9 of cycle 2 today.  2. IV access:  Status post Port-A-Cath placement on 03/30/2014.patient is currently not utilizing EMLA cream secondary to financial constraints but states that he is tolerating Port-A-Cath access with out the EMLA cream without difficulty.   3. Antiemetics: He has a prescription for Compazine which have helped his symptoms.  4. 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  5. Right groin cellulitis: Resolved, patient completed antibiotics as prescribed.   6. Followup: Will be on 05/02/2014 for day 9 of cycle 3 with a clinical visit also.    Awilda Metro E, PA-C  8/4/20159:25 AM

## 2014-04-12 ENCOUNTER — Encounter: Payer: Self-pay | Admitting: *Deleted

## 2014-04-12 ENCOUNTER — Other Ambulatory Visit: Payer: Self-pay | Admitting: *Deleted

## 2014-04-13 NOTE — Patient Instructions (Signed)
Continue labs and chemotherapy as scheduled Follow up on 05/02/14 for another symptom management visit

## 2014-04-18 ENCOUNTER — Ambulatory Visit (HOSPITAL_BASED_OUTPATIENT_CLINIC_OR_DEPARTMENT_OTHER): Payer: Self-pay

## 2014-04-18 ENCOUNTER — Other Ambulatory Visit (HOSPITAL_BASED_OUTPATIENT_CLINIC_OR_DEPARTMENT_OTHER): Payer: Self-pay

## 2014-04-18 VITALS — BP 117/82 | HR 95 | Temp 99.4°F | Resp 18

## 2014-04-18 DIAGNOSIS — C6291 Malignant neoplasm of right testis, unspecified whether descended or undescended: Secondary | ICD-10-CM

## 2014-04-18 DIAGNOSIS — C6290 Malignant neoplasm of unspecified testis, unspecified whether descended or undescended: Secondary | ICD-10-CM

## 2014-04-18 DIAGNOSIS — Z5111 Encounter for antineoplastic chemotherapy: Secondary | ICD-10-CM

## 2014-04-18 LAB — CBC WITH DIFFERENTIAL/PLATELET
BASO%: 0.7 % (ref 0.0–2.0)
BASOS ABS: 0 10*3/uL (ref 0.0–0.1)
EOS%: 3.7 % (ref 0.0–7.0)
Eosinophils Absolute: 0.1 10*3/uL (ref 0.0–0.5)
HEMATOCRIT: 31.7 % — AB (ref 38.4–49.9)
HEMOGLOBIN: 10.5 g/dL — AB (ref 13.0–17.1)
LYMPH%: 75 % — ABNORMAL HIGH (ref 14.0–49.0)
MCH: 27 pg — ABNORMAL LOW (ref 27.2–33.4)
MCHC: 33 g/dL (ref 32.0–36.0)
MCV: 81.9 fL (ref 79.3–98.0)
MONO#: 0.2 10*3/uL (ref 0.1–0.9)
MONO%: 16.7 % — ABNORMAL HIGH (ref 0.0–14.0)
NEUT#: 0.1 10*3/uL — CL (ref 1.5–6.5)
NEUT%: 3.9 % — AB (ref 39.0–75.0)
PLATELETS: 69 10*3/uL — AB (ref 140–400)
RBC: 3.87 10*6/uL — ABNORMAL LOW (ref 4.20–5.82)
RDW: 13.3 % (ref 11.0–14.6)
WBC: 1.3 10*3/uL — AB (ref 4.0–10.3)
lymph#: 1 10*3/uL (ref 0.9–3.3)

## 2014-04-18 LAB — COMPREHENSIVE METABOLIC PANEL
ALT: 26 U/L (ref 0–53)
AST: 14 U/L (ref 0–37)
Albumin: 3.5 g/dL (ref 3.5–5.2)
Alkaline Phosphatase: 81 U/L (ref 39–117)
BUN: 8 mg/dL (ref 6–23)
CALCIUM: 9.6 mg/dL (ref 8.4–10.5)
CHLORIDE: 97 meq/L (ref 96–112)
CO2: 25 mEq/L (ref 19–32)
CREATININE: 0.71 mg/dL (ref 0.50–1.35)
GLUCOSE: 117 mg/dL — AB (ref 70–99)
Potassium: 4.1 mEq/L (ref 3.5–5.3)
Sodium: 136 mEq/L (ref 135–145)
Total Bilirubin: <0.2 mg/dL — ABNORMAL LOW (ref 0.2–1.2)
Total Protein: 8 g/dL (ref 6.0–8.3)

## 2014-04-18 MED ORDER — SODIUM CHLORIDE 0.9 % IV SOLN
Freq: Once | INTRAVENOUS | Status: AC
  Administered 2014-04-18: 10:00:00 via INTRAVENOUS

## 2014-04-18 MED ORDER — HEPARIN SOD (PORK) LOCK FLUSH 100 UNIT/ML IV SOLN
500.0000 [IU] | Freq: Once | INTRAVENOUS | Status: AC | PRN
  Administered 2014-04-18: 500 [IU]
  Filled 2014-04-18: qty 5

## 2014-04-18 MED ORDER — SODIUM CHLORIDE 0.9 % IV SOLN
30.0000 [IU] | Freq: Once | INTRAVENOUS | Status: AC
  Administered 2014-04-18: 30 [IU] via INTRAVENOUS
  Filled 2014-04-18: qty 10

## 2014-04-18 MED ORDER — SODIUM CHLORIDE 0.9 % IJ SOLN
10.0000 mL | INTRAMUSCULAR | Status: DC | PRN
  Administered 2014-04-18: 10 mL
  Filled 2014-04-18: qty 10

## 2014-04-18 MED ORDER — PROCHLORPERAZINE MALEATE 10 MG PO TABS
ORAL_TABLET | ORAL | Status: AC
  Filled 2014-04-18: qty 1

## 2014-04-18 MED ORDER — PROCHLORPERAZINE MALEATE 10 MG PO TABS
10.0000 mg | ORAL_TABLET | Freq: Once | ORAL | Status: AC
  Administered 2014-04-18: 10 mg via ORAL

## 2014-04-18 NOTE — Patient Instructions (Signed)
[place] Discharge Instructions for Patients Receiving Chemotherapy  Today you received the following chemotherapy agents bleomycin.  To help prevent nausea and vomiting after your treatment, we encourage you to take your nausea medication compazine.   If you develop nausea and vomiting that is not controlled by your nausea medication, call the clinic.   BELOW ARE SYMPTOMS THAT SHOULD BE REPORTED IMMEDIATELY:  *FEVER GREATER THAN 100.5 F  *CHILLS WITH OR WITHOUT FEVER  NAUSEA AND VOMITING THAT IS NOT CONTROLLED WITH YOUR NAUSEA MEDICATION  *UNUSUAL SHORTNESS OF BREATH  *UNUSUAL BRUISING OR BLEEDING  TENDERNESS IN MOUTH AND THROAT WITH OR WITHOUT PRESENCE OF ULCERS  *URINARY PROBLEMS  *BOWEL PROBLEMS  UNUSUAL RASH Items with * indicate a potential emergency and should be followed up as soon as possible.  Feel free to call the clinic you have any questions or concerns. The clinic phone number is [phone].

## 2014-04-18 NOTE — Progress Notes (Signed)
Discussed CBC results with Dr. Alen Blew.  VO given and read back to treat with bleomycin today despite counts  WBC 1.3, ANC 0.1 and platelets 69.  Temp 99.4 Reviewed with patient with teach back method - neutropenic precautions. Patient able to verbalized precautions and when to contact us.  Also discussed with patient re: platelets pf 69K and to watch for any signs of bleeding and notify us. Reviewd precautions again with patient.  He has a ride home.  Sent patient out with masks to wear when needed.

## 2014-04-24 ENCOUNTER — Ambulatory Visit (HOSPITAL_BASED_OUTPATIENT_CLINIC_OR_DEPARTMENT_OTHER): Payer: Self-pay

## 2014-04-24 ENCOUNTER — Other Ambulatory Visit: Payer: Self-pay | Admitting: Physician Assistant

## 2014-04-24 ENCOUNTER — Other Ambulatory Visit (HOSPITAL_BASED_OUTPATIENT_CLINIC_OR_DEPARTMENT_OTHER): Payer: Self-pay

## 2014-04-24 ENCOUNTER — Other Ambulatory Visit: Payer: Self-pay | Admitting: Oncology

## 2014-04-24 VITALS — BP 121/77 | HR 78 | Temp 97.8°F

## 2014-04-24 DIAGNOSIS — C6291 Malignant neoplasm of right testis, unspecified whether descended or undescended: Secondary | ICD-10-CM

## 2014-04-24 DIAGNOSIS — Z5111 Encounter for antineoplastic chemotherapy: Secondary | ICD-10-CM

## 2014-04-24 DIAGNOSIS — C6290 Malignant neoplasm of unspecified testis, unspecified whether descended or undescended: Secondary | ICD-10-CM

## 2014-04-24 LAB — CBC WITH DIFFERENTIAL/PLATELET
BASO%: 0.7 % (ref 0.0–2.0)
Basophils Absolute: 0.1 10*3/uL (ref 0.0–0.1)
EOS ABS: 0.1 10*3/uL (ref 0.0–0.5)
EOS%: 0.6 % (ref 0.0–7.0)
HCT: 32.9 % — ABNORMAL LOW (ref 38.4–49.9)
HEMOGLOBIN: 10.9 g/dL — AB (ref 13.0–17.1)
LYMPH%: 19.7 % (ref 14.0–49.0)
MCH: 27.2 pg (ref 27.2–33.4)
MCHC: 33 g/dL (ref 32.0–36.0)
MCV: 82.4 fL (ref 79.3–98.0)
MONO#: 1.9 10*3/uL — AB (ref 0.1–0.9)
MONO%: 12.8 % (ref 0.0–14.0)
NEUT%: 66.2 % (ref 39.0–75.0)
NEUTROS ABS: 9.9 10*3/uL — AB (ref 1.5–6.5)
PLATELETS: 219 10*3/uL (ref 140–400)
RBC: 3.99 10*6/uL — ABNORMAL LOW (ref 4.20–5.82)
RDW: 13.9 % (ref 11.0–14.6)
WBC: 14.9 10*3/uL — AB (ref 4.0–10.3)
lymph#: 2.9 10*3/uL (ref 0.9–3.3)

## 2014-04-24 LAB — COMPREHENSIVE METABOLIC PANEL (CC13)
ALT: 107 U/L — ABNORMAL HIGH (ref 0–55)
ANION GAP: 10 meq/L (ref 3–11)
AST: 35 U/L — ABNORMAL HIGH (ref 5–34)
Albumin: 3.4 g/dL — ABNORMAL LOW (ref 3.5–5.0)
Alkaline Phosphatase: 77 U/L (ref 40–150)
BUN: 7.5 mg/dL (ref 7.0–26.0)
CO2: 26 meq/L (ref 22–29)
CREATININE: 0.8 mg/dL (ref 0.7–1.3)
Calcium: 9.8 mg/dL (ref 8.4–10.4)
Chloride: 103 mEq/L (ref 98–109)
GLUCOSE: 94 mg/dL (ref 70–140)
Potassium: 3.9 mEq/L (ref 3.5–5.1)
Sodium: 139 mEq/L (ref 136–145)
Total Protein: 8.1 g/dL (ref 6.4–8.3)

## 2014-04-24 LAB — TECHNOLOGIST REVIEW

## 2014-04-24 MED ORDER — SODIUM CHLORIDE 0.9 % IV SOLN
150.0000 mg | Freq: Once | INTRAVENOUS | Status: AC
  Administered 2014-04-24: 150 mg via INTRAVENOUS
  Filled 2014-04-24: qty 5

## 2014-04-24 MED ORDER — HEPARIN SOD (PORK) LOCK FLUSH 100 UNIT/ML IV SOLN
500.0000 [IU] | Freq: Once | INTRAVENOUS | Status: AC | PRN
  Administered 2014-04-24: 500 [IU]
  Filled 2014-04-24: qty 5

## 2014-04-24 MED ORDER — PALONOSETRON HCL INJECTION 0.25 MG/5ML
INTRAVENOUS | Status: AC
  Filled 2014-04-24: qty 5

## 2014-04-24 MED ORDER — ETOPOSIDE CHEMO INJECTION 1 GM/50ML
100.0000 mg/m2 | Freq: Once | INTRAVENOUS | Status: AC
  Administered 2014-04-24: 200 mg via INTRAVENOUS
  Filled 2014-04-24: qty 10

## 2014-04-24 MED ORDER — SODIUM CHLORIDE 0.9 % IV SOLN
20.0000 mg/m2 | Freq: Once | INTRAVENOUS | Status: AC
  Administered 2014-04-24: 40 mg via INTRAVENOUS
  Filled 2014-04-24: qty 40

## 2014-04-24 MED ORDER — DEXAMETHASONE SODIUM PHOSPHATE 20 MG/5ML IJ SOLN
INTRAMUSCULAR | Status: AC
  Filled 2014-04-24: qty 5

## 2014-04-24 MED ORDER — PALONOSETRON HCL INJECTION 0.25 MG/5ML
0.2500 mg | Freq: Once | INTRAVENOUS | Status: AC
  Administered 2014-04-24: 0.25 mg via INTRAVENOUS

## 2014-04-24 MED ORDER — SODIUM CHLORIDE 0.9 % IJ SOLN
10.0000 mL | INTRAMUSCULAR | Status: DC | PRN
  Administered 2014-04-24: 10 mL
  Filled 2014-04-24: qty 10

## 2014-04-24 MED ORDER — POTASSIUM CHLORIDE 2 MEQ/ML IV SOLN
Freq: Once | INTRAVENOUS | Status: AC
  Administered 2014-04-24: 09:00:00 via INTRAVENOUS
  Filled 2014-04-24: qty 10

## 2014-04-24 MED ORDER — DEXAMETHASONE SODIUM PHOSPHATE 20 MG/5ML IJ SOLN
12.0000 mg | Freq: Once | INTRAMUSCULAR | Status: AC
  Administered 2014-04-24: 12 mg via INTRAVENOUS

## 2014-04-24 MED ORDER — SODIUM CHLORIDE 0.9 % IV SOLN
Freq: Once | INTRAVENOUS | Status: AC
  Administered 2014-04-24: 09:00:00 via INTRAVENOUS

## 2014-04-24 NOTE — Patient Instructions (Signed)
[facility] Discharge Instructions for Patients Receiving Chemotherapy  Today you received the following chemotherapy agents cisplatin/etoposide.   To help prevent nausea and vomiting after your treatment, we encourage you to take your nausea medication as directed.    If you develop nausea and vomiting that is not controlled by your nausea medication, call the clinic.   BELOW ARE SYMPTOMS THAT SHOULD BE REPORTED IMMEDIATELY:  *FEVER GREATER THAN 100.5 F  *CHILLS WITH OR WITHOUT FEVER  NAUSEA AND VOMITING THAT IS NOT CONTROLLED WITH YOUR NAUSEA MEDICATION  *UNUSUAL SHORTNESS OF BREATH  *UNUSUAL BRUISING OR BLEEDING  TENDERNESS IN MOUTH AND THROAT WITH OR WITHOUT PRESENCE OF ULCERS  *URINARY PROBLEMS  *BOWEL PROBLEMS  UNUSUAL RASH Items with * indicate a potential emergency and should be followed up as soon as possible.  Feel free to call the clinic you have any questions or concerns. The clinic phone number is [phone].  

## 2014-04-25 ENCOUNTER — Other Ambulatory Visit: Payer: Self-pay | Admitting: Oncology

## 2014-04-25 ENCOUNTER — Ambulatory Visit (HOSPITAL_BASED_OUTPATIENT_CLINIC_OR_DEPARTMENT_OTHER): Payer: Self-pay

## 2014-04-25 ENCOUNTER — Other Ambulatory Visit: Payer: Self-pay | Admitting: Physician Assistant

## 2014-04-25 ENCOUNTER — Telehealth: Payer: Self-pay | Admitting: Oncology

## 2014-04-25 VITALS — BP 135/82 | HR 84 | Temp 98.3°F | Resp 18

## 2014-04-25 DIAGNOSIS — Z5111 Encounter for antineoplastic chemotherapy: Secondary | ICD-10-CM

## 2014-04-25 DIAGNOSIS — C6291 Malignant neoplasm of right testis, unspecified whether descended or undescended: Secondary | ICD-10-CM

## 2014-04-25 DIAGNOSIS — C6290 Malignant neoplasm of unspecified testis, unspecified whether descended or undescended: Secondary | ICD-10-CM

## 2014-04-25 MED ORDER — PROCHLORPERAZINE MALEATE 10 MG PO TABS: 10.0000 mg | ORAL_TABLET | Freq: Four times a day (QID) | ORAL | Status: DC | PRN

## 2014-04-25 MED ORDER — HEPARIN SOD (PORK) LOCK FLUSH 100 UNIT/ML IV SOLN
500.0000 [IU] | Freq: Once | INTRAVENOUS | Status: AC | PRN
  Administered 2014-04-25: 500 [IU]
  Filled 2014-04-25: qty 5

## 2014-04-25 MED ORDER — SODIUM CHLORIDE 0.9 % IJ SOLN
10.0000 mL | INTRAMUSCULAR | Status: DC | PRN
  Administered 2014-04-25: 10 mL
  Filled 2014-04-25: qty 10

## 2014-04-25 MED ORDER — ETOPOSIDE CHEMO INJECTION 1 GM/50ML
100.0000 mg/m2 | Freq: Once | INTRAVENOUS | Status: AC
  Administered 2014-04-25: 200 mg via INTRAVENOUS
  Filled 2014-04-25: qty 10

## 2014-04-25 MED ORDER — DEXAMETHASONE SODIUM PHOSPHATE 20 MG/5ML IJ SOLN
INTRAMUSCULAR | Status: AC
  Filled 2014-04-25: qty 5

## 2014-04-25 MED ORDER — POTASSIUM CHLORIDE 2 MEQ/ML IV SOLN
Freq: Once | INTRAVENOUS | Status: AC
  Administered 2014-04-25: 09:00:00 via INTRAVENOUS
  Filled 2014-04-25: qty 10

## 2014-04-25 MED ORDER — DEXAMETHASONE SODIUM PHOSPHATE 20 MG/5ML IJ SOLN
20.0000 mg | Freq: Once | INTRAMUSCULAR | Status: AC
  Administered 2014-04-25: 20 mg via INTRAVENOUS

## 2014-04-25 MED ORDER — SODIUM CHLORIDE 0.9 % IV SOLN
Freq: Once | INTRAVENOUS | Status: AC
  Administered 2014-04-25: 09:00:00 via INTRAVENOUS

## 2014-04-25 MED ORDER — SODIUM CHLORIDE 0.9 % IV SOLN
20.0000 mg/m2 | Freq: Once | INTRAVENOUS | Status: AC
  Administered 2014-04-25: 40 mg via INTRAVENOUS
  Filled 2014-04-25: qty 40

## 2014-04-25 MED ORDER — SODIUM CHLORIDE 0.9 % IV SOLN
30.0000 [IU] | Freq: Once | INTRAVENOUS | Status: AC
  Administered 2014-04-25: 30 [IU] via INTRAVENOUS
  Filled 2014-04-25: qty 10

## 2014-04-25 NOTE — Telephone Encounter (Signed)
Pt confirmed labs/ov per 08/18 POF, gave pt AVS...KJ °

## 2014-04-25 NOTE — Patient Instructions (Signed)
Cisplatin injection What is this medicine? CISPLATIN (SIS pla tin) is a chemotherapy drug. It targets fast dividing cells, like cancer cells, and causes these cells to die. This medicine is used to treat many types of cancer like bladder, ovarian, and testicular cancers. This medicine may be used for other purposes; ask your health care provider or pharmacist if you have questions. COMMON BRAND NAME(S): Platinol, Platinol -AQ What should I tell my health care provider before I take this medicine? They need to know if you have any of these conditions: -blood disorders -hearing problems -kidney disease -recent or ongoing radiation therapy -an unusual or allergic reaction to cisplatin, carboplatin, other chemotherapy, other medicines, foods, dyes, or preservatives -pregnant or trying to get pregnant -breast-feeding How should I use this medicine? This drug is given as an infusion into a vein. It is administered in a hospital or clinic by a specially trained health care professional. Talk to your pediatrician regarding the use of this medicine in children. Special care may be needed. Overdosage: If you think you have taken too much of this medicine contact a poison control center or emergency room at once. NOTE: This medicine is only for you. Do not share this medicine with others. What if I miss a dose? It is important not to miss a dose. Call your doctor or health care professional if you are unable to keep an appointment. What may interact with this medicine? -dofetilide -foscarnet -medicines for seizures -medicines to increase blood counts like filgrastim, pegfilgrastim, sargramostim -probenecid -pyridoxine used with altretamine -rituximab -some antibiotics like amikacin, gentamicin, neomycin, polymyxin B, streptomycin, tobramycin -sulfinpyrazone -vaccines -zalcitabine Talk to your doctor or health care professional before taking any of these  medicines: -acetaminophen -aspirin -ibuprofen -ketoprofen -naproxen This list may not describe all possible interactions. Give your health care provider a list of all the medicines, herbs, non-prescription drugs, or dietary supplements you use. Also tell them if you smoke, drink alcohol, or use illegal drugs. Some items may interact with your medicine. What should I watch for while using this medicine? Your condition will be monitored carefully while you are receiving this medicine. You will need important blood work done while you are taking this medicine. This drug may make you feel generally unwell. This is not uncommon, as chemotherapy can affect healthy cells as well as cancer cells. Report any side effects. Continue your course of treatment even though you feel ill unless your doctor tells you to stop. In some cases, you may be given additional medicines to help with side effects. Follow all directions for their use. Call your doctor or health care professional for advice if you get a fever, chills or sore throat, or other symptoms of a cold or flu. Do not treat yourself. This drug decreases your body's ability to fight infections. Try to avoid being around people who are sick. This medicine may increase your risk to bruise or bleed. Call your doctor or health care professional if you notice any unusual bleeding. Be careful brushing and flossing your teeth or using a toothpick because you may get an infection or bleed more easily. If you have any dental work done, tell your dentist you are receiving this medicine. Avoid taking products that contain aspirin, acetaminophen, ibuprofen, naproxen, or ketoprofen unless instructed by your doctor. These medicines may hide a fever. Do not become pregnant while taking this medicine. Women should inform their doctor if they wish to become pregnant or think they might be pregnant. There is a   potential for serious side effects to an unborn child. Talk to  your health care professional or pharmacist for more information. Do not breast-feed an infant while taking this medicine. Drink fluids as directed while you are taking this medicine. This will help protect your kidneys. Call your doctor or health care professional if you get diarrhea. Do not treat yourself. What side effects may I notice from receiving this medicine? Side effects that you should report to your doctor or health care professional as soon as possible: -allergic reactions like skin rash, itching or hives, swelling of the face, lips, or tongue -signs of infection - fever or chills, cough, sore throat, pain or difficulty passing urine -signs of decreased platelets or bleeding - bruising, pinpoint red spots on the skin, black, tarry stools, nosebleeds -signs of decreased red blood cells - unusually weak or tired, fainting spells, lightheadedness -breathing problems -changes in hearing -gout pain -low blood counts - This drug may decrease the number of white blood cells, red blood cells and platelets. You may be at increased risk for infections and bleeding. -nausea and vomiting -pain, swelling, redness or irritation at the injection site -pain, tingling, numbness in the hands or feet -problems with balance, movement -trouble passing urine or change in the amount of urine Side effects that usually do not require medical attention (report to your doctor or health care professional if they continue or are bothersome): -changes in vision -loss of appetite -metallic taste in the mouth or changes in taste This list may not describe all possible side effects. Call your doctor for medical advice about side effects. You may report side effects to FDA at 1-800-FDA-1088. Where should I keep my medicine? This drug is given in a hospital or clinic and will not be stored at home. NOTE: This sheet is a summary. It may not cover all possible information. If you have questions about this medicine,  talk to your doctor, pharmacist, or health care provider.  2015, [employer] Standard. (2007-11-30 14:40:54) Bleomycin injection What is this medicine? BLEOMYCIN (blee oh MYE sin) is a chemotherapy drug. It is used to treat many kinds of cancer like lymphoma, cervical cancer, head and neck cancer, and testicular cancer. It is also used to prevent and to treat fluid build-up around the lungs caused by some cancers. This medicine may be used for other purposes; ask your health care provider or pharmacist if you have questions. COMMON BRAND NAME(S): Blenoxane What should I tell my health care provider before I take this medicine? They need to know if you have any of these conditions: -cigarette smoker -kidney disease -lung disease -recent or ongoing radiation therapy -an unusual or allergic reaction to bleomycin, other chemotherapy agents, other medicines, foods, dyes, or preservatives -pregnant or trying to get pregnant -breast-feeding How should I use this medicine? This drug is given as an infusion into a vein or a body cavity. It can also be given as an injection into a muscle or under the skin. It is administered in a hospital or clinic by a specially trained health care professional. Talk to your pediatrician regarding the use of this medicine in children. Special care may be needed. Overdosage: If you think you have taken too much of this medicine contact a poison control center or emergency room at once. NOTE: This medicine is only for you. Do not share this medicine with others. What if I miss a dose? It is important not to miss your dose. Call your doctor or health care  professional if you are unable to keep an appointment. What may interact with this medicine? -certain antibiotics given by injection -cisplatin -cyclosporine -diuretics -foscarnet -medicines to increase blood counts like filgrastim, pegfilgrastim, sargramostim -vaccines This list may not describe all possible  interactions. Give your health care provider a list of all the medicines, herbs, non-prescription drugs, or dietary supplements you use. Also tell them if you smoke, drink alcohol, or use illegal drugs. Some items may interact with your medicine. What should I watch for while using this medicine? Visit your doctor for checks on your progress. This drug may make you feel generally unwell. This is not uncommon, as chemotherapy can affect healthy cells as well as cancer cells. Report any side effects. Continue your course of treatment even though you feel ill unless your doctor tells you to stop. Call your doctor or health care professional for advice if you get a fever, chills or sore throat, or other symptoms of a cold or flu. Do not treat yourself. This drug decreases your body's ability to fight infections. Try to avoid being around people who are sick. Avoid taking products that contain aspirin, acetaminophen, ibuprofen, naproxen, or ketoprofen unless instructed by your doctor. These medicines may hide a fever. Do not become pregnant while taking this medicine. Women should inform their doctor if they wish to become pregnant or think they might be pregnant. There is a potential for serious side effects to an unborn child. Talk to your health care professional or pharmacist for more information. Do not breast-feed an infant while taking this medicine. There is a maximum amount of this medicine you should receive throughout your life. The amount depends on the medical condition being treated and your overall health. Your doctor will watch how much of this medicine you receive in your lifetime. Tell your doctor if you have taken this medicine before. What side effects may I notice from receiving this medicine? Side effects that you should report to your doctor or health care professional as soon as possible: -allergic reactions like skin rash, itching or hives, swelling of the face, lips, or  tongue -breathing problems -chest pain -confusion -cough -fast, irregular heartbeat -feeling faint or lightheaded, falls -fever or chills -mouth sores -pain, tingling, numbness in the hands or feet -trouble passing urine or change in the amount of urine -yellowing of the eyes or skin Side effects that usually do not require medical attention (report to your doctor or health care professional if they continue or are bothersome): -darker skin color -hair loss -irritation at site where injected -loss of appetite -nail changes -nausea and vomiting -weight loss This list may not describe all possible side effects. Call your doctor for medical advice about side effects. You may report side effects to FDA at 1-800-FDA-1088. Where should I keep my medicine? This drug is given in a hospital or clinic and will not be stored at home. NOTE: This sheet is a summary. It may not cover all possible information. If you have questions about this medicine, talk to your doctor, pharmacist, or health care provider.  2015, [employer] Standard. (2012-12-21 09:36:48) Etoposide, VP-16 capsules What is this medicine? ETOPOSIDE, VP-16 (e toe POE side) is a chemotherapy drug. It is used to treat small cell lung cancer and other cancers. This medicine may be used for other purposes; ask your health care provider or pharmacist if you have questions. COMMON BRAND NAME(S): VePesid What should I tell my health care provider before I take this medicine? They need  to know if you have any of these conditions: -infection -kidney disease -low blood counts, like low white cell, platelet, or red cell counts -an unusual or allergic reaction to etoposide, other chemotherapeutic agents, other medicines, foods, dyes, or preservatives -pregnant or trying to get pregnant -breast-feeding How should I use this medicine? Take this medicine by mouth with a glass of water. Follow the directions on the prescription label. Do  not open, crush, or chew the capsules. It is advisable to wear gloves when handling this medicine. Take your medicine at regular intervals. Do not take it more often than directed. Do not stop taking except on your doctor's advice. Talk to your pediatrician regarding the use of this medicine in children. Special care may be needed. Overdosage: If you think you have taken too much of this medicine contact a poison control center or emergency room at once. NOTE: This medicine is only for you. Do not share this medicine with others. What if I miss a dose? If you miss a dose, take it as soon as you can. If it is almost time for your next dose, take only that dose. Do not take double or extra doses. What may interact with this medicine? -cyclosporine -medicines to increase blood counts like filgrastim, pegfilgrastim, sargramostim -vaccines This list may not describe all possible interactions. Give your health care provider a list of all the medicines, herbs, non-prescription drugs, or dietary supplements you use. Also tell them if you smoke, drink alcohol, or use illegal drugs. Some items may interact with your medicine. What should I watch for while using this medicine? Visit your doctor for checks on your progress. This drug may make you feel generally unwell. This is not uncommon, as chemotherapy can affect healthy cells as well as cancer cells. Report any side effects. Continue your course of treatment even though you feel ill unless your doctor tells you to stop. In some cases, you may be given additional medicines to help with side effects. Follow all directions for their use. Call your doctor or health care professional for advice if you get a fever, chills or sore throat, or other symptoms of a cold or flu. Do not treat yourself. This drug decreases your body's ability to fight infections. Try to avoid being around people who are sick. This medicine may increase your risk to bruise or bleed. Call  your doctor or health care professional if you notice any unusual bleeding. Be careful brushing and flossing your teeth or using a toothpick because you may get an infection or bleed more easily. If you have any dental work done, tell your dentist you are receiving this medicine. Avoid taking products that contain aspirin, acetaminophen, ibuprofen, naproxen, or ketoprofen unless instructed by your doctor. These medicines may hide a fever. Do not become pregnant while taking this medicine. Women should inform their doctor if they wish to become pregnant or think they might be pregnant. There is a potential for serious side effects to an unborn child. Talk to your health care professional or pharmacist for more information. Do not breast-feed an infant while taking this medicine. What side effects may I notice from receiving this medicine? Side effects that you should report to your doctor or health care professional as soon as possible: -allergic reactions like skin rash, itching or hives, swelling of the face, lips, or tongue -low blood counts - this medicine may decrease the number of white blood cells, red blood cells and platelets. You may be at increased  risk for infections and bleeding. -signs of infection - fever or chills, cough, sore throat, pain or difficulty passing urine -signs of decreased platelets or bleeding - bruising, pinpoint red spots on the skin, black, tarry stools, blood in the urine -signs of decreased red blood cells - unusually weak or tired, fainting spells, lightheadedness -breathing problems -changes in vision -mouth or throat sores or ulcers -pain, tingling, numbness in the hands or feet -redness, blistering, peeling or loosening of the skin, including inside the mouth -seizures -vomiting Side effects that usually do not require medical attention (report to your doctor or health care professional if they continue or are bothersome): -change in taste -diarrhea -hair  loss -nausea -stomach pain This list may not describe all possible side effects. Call your doctor for medical advice about side effects. You may report side effects to FDA at 1-800-FDA-1088. Where should I keep my medicine? Keep out of the reach of children. Store in a refrigerator between 2 and 8 degrees C (36 and 46 degrees F). Do not freeze. Throw away any unused medicine after the expiration date. NOTE: This sheet is a summary. It may not cover all possible information. If you have questions about this medicine, talk to your doctor, pharmacist, or health care provider.  2015, [employer]vier/Gold Standard. (2007-12-27 17:22:30)

## 2014-04-26 ENCOUNTER — Ambulatory Visit (HOSPITAL_BASED_OUTPATIENT_CLINIC_OR_DEPARTMENT_OTHER): Payer: Self-pay

## 2014-04-26 VITALS — BP 140/89 | HR 83 | Temp 98.0°F | Resp 18

## 2014-04-26 DIAGNOSIS — C6291 Malignant neoplasm of right testis, unspecified whether descended or undescended: Secondary | ICD-10-CM

## 2014-04-26 DIAGNOSIS — Z5111 Encounter for antineoplastic chemotherapy: Secondary | ICD-10-CM

## 2014-04-26 DIAGNOSIS — C6290 Malignant neoplasm of unspecified testis, unspecified whether descended or undescended: Secondary | ICD-10-CM

## 2014-04-26 MED ORDER — SODIUM CHLORIDE 0.9 % IV SOLN
20.0000 mg/m2 | Freq: Once | INTRAVENOUS | Status: AC
  Administered 2014-04-26: 40 mg via INTRAVENOUS
  Filled 2014-04-26: qty 40

## 2014-04-26 MED ORDER — SODIUM CHLORIDE 0.9 % IV SOLN
100.0000 mg/m2 | Freq: Once | INTRAVENOUS | Status: AC
  Administered 2014-04-26: 200 mg via INTRAVENOUS
  Filled 2014-04-26: qty 10

## 2014-04-26 MED ORDER — PALONOSETRON HCL INJECTION 0.25 MG/5ML
0.2500 mg | Freq: Once | INTRAVENOUS | Status: AC
  Administered 2014-04-26: 0.25 mg via INTRAVENOUS

## 2014-04-26 MED ORDER — DEXAMETHASONE SODIUM PHOSPHATE 20 MG/5ML IJ SOLN
INTRAMUSCULAR | Status: AC
  Filled 2014-04-26: qty 5

## 2014-04-26 MED ORDER — DEXAMETHASONE SODIUM PHOSPHATE 20 MG/5ML IJ SOLN
20.0000 mg | Freq: Once | INTRAMUSCULAR | Status: AC
  Administered 2014-04-26: 20 mg via INTRAVENOUS

## 2014-04-26 MED ORDER — POTASSIUM CHLORIDE 2 MEQ/ML IV SOLN
Freq: Once | INTRAVENOUS | Status: AC
  Administered 2014-04-26: 09:00:00 via INTRAVENOUS
  Filled 2014-04-26: qty 10

## 2014-04-26 MED ORDER — SODIUM CHLORIDE 0.9 % IJ SOLN
10.0000 mL | INTRAMUSCULAR | Status: DC | PRN
Start: 2014-04-26 — End: 2014-04-26
  Administered 2014-04-26: 10 mL
  Filled 2014-04-26: qty 10

## 2014-04-26 MED ORDER — HEPARIN SOD (PORK) LOCK FLUSH 100 UNIT/ML IV SOLN
500.0000 [IU] | Freq: Once | INTRAVENOUS | Status: AC | PRN
  Administered 2014-04-26: 500 [IU]
  Filled 2014-04-26: qty 5

## 2014-04-26 MED ORDER — SODIUM CHLORIDE 0.9 % IV SOLN
Freq: Once | INTRAVENOUS | Status: AC
  Administered 2014-04-26: 09:00:00 via INTRAVENOUS

## 2014-04-26 MED ORDER — PALONOSETRON HCL INJECTION 0.25 MG/5ML
INTRAVENOUS | Status: AC
  Filled 2014-04-26: qty 5

## 2014-04-26 NOTE — Patient Instructions (Signed)
[facility] Discharge Instructions for Patients Receiving Chemotherapy  Today you received the following chemotherapy agents Cisplatin/Etoposide.   To help prevent nausea and vomiting after your treatment, we encourage you to take your nausea medication as directed.    If you develop nausea and vomiting that is not controlled by your nausea medication, call the clinic.   BELOW ARE SYMPTOMS THAT SHOULD BE REPORTED IMMEDIATELY:  *FEVER GREATER THAN 100.5 F  *CHILLS WITH OR WITHOUT FEVER  NAUSEA AND VOMITING THAT IS NOT CONTROLLED WITH YOUR NAUSEA MEDICATION  *UNUSUAL SHORTNESS OF BREATH  *UNUSUAL BRUISING OR BLEEDING  TENDERNESS IN MOUTH AND THROAT WITH OR WITHOUT PRESENCE OF ULCERS  *URINARY PROBLEMS  *BOWEL PROBLEMS  UNUSUAL RASH Items with * indicate a potential emergency and should be followed up as soon as possible.  Feel free to call the clinic you have any questions or concerns. The clinic phone number is [phone].    

## 2014-04-27 ENCOUNTER — Ambulatory Visit (HOSPITAL_BASED_OUTPATIENT_CLINIC_OR_DEPARTMENT_OTHER): Payer: Self-pay

## 2014-04-27 VITALS — BP 139/95 | HR 74 | Temp 98.5°F | Resp 18

## 2014-04-27 DIAGNOSIS — C6290 Malignant neoplasm of unspecified testis, unspecified whether descended or undescended: Secondary | ICD-10-CM

## 2014-04-27 DIAGNOSIS — C6291 Malignant neoplasm of right testis, unspecified whether descended or undescended: Secondary | ICD-10-CM

## 2014-04-27 DIAGNOSIS — Z5111 Encounter for antineoplastic chemotherapy: Secondary | ICD-10-CM

## 2014-04-27 MED ORDER — DEXAMETHASONE SODIUM PHOSPHATE 20 MG/5ML IJ SOLN
INTRAMUSCULAR | Status: AC
  Filled 2014-04-27: qty 5

## 2014-04-27 MED ORDER — CISPLATIN CHEMO INJECTION 100MG/100ML
20.0000 mg/m2 | Freq: Once | INTRAVENOUS | Status: AC
  Administered 2014-04-27: 40 mg via INTRAVENOUS
  Filled 2014-04-27: qty 40

## 2014-04-27 MED ORDER — SODIUM CHLORIDE 0.9 % IV SOLN
Freq: Once | INTRAVENOUS | Status: AC
  Administered 2014-04-27: 09:00:00 via INTRAVENOUS

## 2014-04-27 MED ORDER — POTASSIUM CHLORIDE 2 MEQ/ML IV SOLN
Freq: Once | INTRAVENOUS | Status: AC
  Administered 2014-04-27: 10:00:00 via INTRAVENOUS
  Filled 2014-04-27: qty 10

## 2014-04-27 MED ORDER — SODIUM CHLORIDE 0.9 % IJ SOLN
10.0000 mL | INTRAMUSCULAR | Status: DC | PRN
  Administered 2014-04-27: 10 mL
  Filled 2014-04-27: qty 10

## 2014-04-27 MED ORDER — HEPARIN SOD (PORK) LOCK FLUSH 100 UNIT/ML IV SOLN
500.0000 [IU] | Freq: Once | INTRAVENOUS | Status: AC | PRN
  Administered 2014-04-27: 500 [IU]
  Filled 2014-04-27: qty 5

## 2014-04-27 MED ORDER — DEXAMETHASONE SODIUM PHOSPHATE 20 MG/5ML IJ SOLN
20.0000 mg | Freq: Once | INTRAMUSCULAR | Status: AC
  Administered 2014-04-27: 20 mg via INTRAVENOUS

## 2014-04-27 MED ORDER — SODIUM CHLORIDE 0.9 % IV SOLN
100.0000 mg/m2 | Freq: Once | INTRAVENOUS | Status: AC
  Administered 2014-04-27: 200 mg via INTRAVENOUS
  Filled 2014-04-27: qty 10

## 2014-04-27 NOTE — Patient Instructions (Signed)
[facility] Discharge Instructions for Patients Receiving Chemotherapy  Today you received the following chemotherapy agents Cisplatin and Etoposide.  To help prevent nausea and vomiting after your treatment, we encourage you to take your nausea medication as prescribed.   If you develop nausea and vomiting that is not controlled by your nausea medication, call the clinic.   BELOW ARE SYMPTOMS THAT SHOULD BE REPORTED IMMEDIATELY:  *FEVER GREATER THAN 100.5 F  *CHILLS WITH OR WITHOUT FEVER  NAUSEA AND VOMITING THAT IS NOT CONTROLLED WITH YOUR NAUSEA MEDICATION  *UNUSUAL SHORTNESS OF BREATH  *UNUSUAL BRUISING OR BLEEDING  TENDERNESS IN MOUTH AND THROAT WITH OR WITHOUT PRESENCE OF ULCERS  *URINARY PROBLEMS  *BOWEL PROBLEMS  UNUSUAL RASH Items with * indicate a potential emergency and should be followed up as soon as possible.  Feel free to call the clinic you have any questions or concerns. The clinic phone number is [phone].    

## 2014-04-27 NOTE — Progress Notes (Signed)
Dr. Alen Blew notified of initial BP 138/97 and recheck 139/95. No additional orders given. OK to proceed with treatment.

## 2014-04-28 ENCOUNTER — Ambulatory Visit (HOSPITAL_BASED_OUTPATIENT_CLINIC_OR_DEPARTMENT_OTHER): Payer: Self-pay

## 2014-04-28 VITALS — BP 131/95 | HR 72 | Temp 97.7°F | Resp 16

## 2014-04-28 DIAGNOSIS — Z5111 Encounter for antineoplastic chemotherapy: Secondary | ICD-10-CM

## 2014-04-28 DIAGNOSIS — C6291 Malignant neoplasm of right testis, unspecified whether descended or undescended: Secondary | ICD-10-CM

## 2014-04-28 DIAGNOSIS — C6290 Malignant neoplasm of unspecified testis, unspecified whether descended or undescended: Secondary | ICD-10-CM

## 2014-04-28 MED ORDER — CISPLATIN CHEMO INJECTION 100MG/100ML
20.0000 mg/m2 | Freq: Once | INTRAVENOUS | Status: AC
  Administered 2014-04-28: 40 mg via INTRAVENOUS
  Filled 2014-04-28: qty 40

## 2014-04-28 MED ORDER — PALONOSETRON HCL INJECTION 0.25 MG/5ML
0.2500 mg | Freq: Once | INTRAVENOUS | Status: AC
  Administered 2014-04-28: 0.25 mg via INTRAVENOUS

## 2014-04-28 MED ORDER — PROCHLORPERAZINE EDISYLATE 5 MG/ML IJ SOLN
INTRAMUSCULAR | Status: AC
  Filled 2014-04-28: qty 2

## 2014-04-28 MED ORDER — POTASSIUM CHLORIDE 2 MEQ/ML IV SOLN
Freq: Once | INTRAVENOUS | Status: AC
  Administered 2014-04-28: 10:00:00 via INTRAVENOUS
  Filled 2014-04-28: qty 10

## 2014-04-28 MED ORDER — PALONOSETRON HCL INJECTION 0.25 MG/5ML
INTRAVENOUS | Status: AC
  Filled 2014-04-28: qty 5

## 2014-04-28 MED ORDER — DEXAMETHASONE SODIUM PHOSPHATE 20 MG/5ML IJ SOLN
INTRAMUSCULAR | Status: AC
  Filled 2014-04-28: qty 5

## 2014-04-28 MED ORDER — HEPARIN SOD (PORK) LOCK FLUSH 100 UNIT/ML IV SOLN
500.0000 [IU] | Freq: Once | INTRAVENOUS | Status: AC | PRN
  Administered 2014-04-28: 500 [IU]
  Filled 2014-04-28: qty 5

## 2014-04-28 MED ORDER — DEXAMETHASONE SODIUM PHOSPHATE 20 MG/5ML IJ SOLN
20.0000 mg | Freq: Once | INTRAMUSCULAR | Status: AC
  Administered 2014-04-28: 20 mg via INTRAVENOUS

## 2014-04-28 MED ORDER — SODIUM CHLORIDE 0.9 % IV SOLN
100.0000 mg/m2 | Freq: Once | INTRAVENOUS | Status: AC
  Administered 2014-04-28: 200 mg via INTRAVENOUS
  Filled 2014-04-28: qty 10

## 2014-04-28 MED ORDER — PROCHLORPERAZINE EDISYLATE 5 MG/ML IJ SOLN
10.0000 mg | Freq: Once | INTRAMUSCULAR | Status: AC
  Administered 2014-04-28: 10 mg via INTRAVENOUS

## 2014-04-28 MED ORDER — SODIUM CHLORIDE 0.9 % IV SOLN
Freq: Once | INTRAVENOUS | Status: AC
  Administered 2014-04-28: 10:00:00 via INTRAVENOUS

## 2014-04-28 MED ORDER — SODIUM CHLORIDE 0.9 % IJ SOLN
10.0000 mL | INTRAMUSCULAR | Status: DC | PRN
  Administered 2014-04-28: 10 mL
  Filled 2014-04-28: qty 10

## 2014-04-28 NOTE — Progress Notes (Signed)
@   1130 voided 250 ml (pre-cddp) @ 1240 voided 450 ml @1445 -reports he still has residual nausea-better than earlier, but persists. Obtained order to give Compazine IV. @1506 -reports nausea is minimal now "1/10". Total urine output today = 1250 cc.

## 2014-04-28 NOTE — Progress Notes (Signed)
Dr. Alen Blew  Mad aware of pt's blood pressures and nausea this morning.  Per Dr. Alen Blew, ok to proceeded with tx.

## 2014-04-29 ENCOUNTER — Ambulatory Visit (HOSPITAL_BASED_OUTPATIENT_CLINIC_OR_DEPARTMENT_OTHER): Payer: Self-pay

## 2014-04-29 VITALS — BP 129/88 | HR 93 | Temp 98.2°F | Resp 20

## 2014-04-29 DIAGNOSIS — C6290 Malignant neoplasm of unspecified testis, unspecified whether descended or undescended: Secondary | ICD-10-CM

## 2014-04-29 DIAGNOSIS — Z5189 Encounter for other specified aftercare: Secondary | ICD-10-CM

## 2014-04-29 DIAGNOSIS — C6291 Malignant neoplasm of right testis, unspecified whether descended or undescended: Secondary | ICD-10-CM

## 2014-04-29 MED ORDER — PEGFILGRASTIM INJECTION 6 MG/0.6ML
6.0000 mg | Freq: Once | SUBCUTANEOUS | Status: AC
  Administered 2014-04-29: 6 mg via SUBCUTANEOUS

## 2014-04-29 NOTE — Patient Instructions (Signed)
Pegfilgrastim injection What is this medicine? PEGFILGRASTIM (peg fil GRA stim) is a long-acting granulocyte colony-stimulating factor that stimulates the growth of neutrophils, a type of white blood cell important in the body's fight against infection. It is used to reduce the incidence of fever and infection in patients with certain types of cancer who are receiving chemotherapy that affects the bone marrow. This medicine may be used for other purposes; ask your health care provider or pharmacist if you have questions. COMMON BRAND NAME(S): Neulasta What should I tell my health care provider before I take this medicine? They need to know if you have any of these conditions: -latex allergy -ongoing radiation therapy -sickle cell disease -skin reactions to acrylic adhesives (On-Body Injector only) -an unusual or allergic reaction to pegfilgrastim, filgrastim, other medicines, foods, dyes, or preservatives -pregnant or trying to get pregnant -breast-feeding How should I use this medicine? This medicine is for injection under the skin. If you get this medicine at home, you will be taught how to prepare and give the pre-filled syringe or how to use the On-body Injector. Refer to the patient Instructions for Use for detailed instructions. Use exactly as directed. Take your medicine at regular intervals. Do not take your medicine more often than directed. It is important that you put your used needles and syringes in a special sharps container. Do not put them in a trash can. If you do not have a sharps container, call your pharmacist or healthcare provider to get one. Talk to your pediatrician regarding the use of this medicine in children. Special care may be needed. Overdosage: If you think you have taken too much of this medicine contact a poison control center or emergency room at once. NOTE: This medicine is only for you. Do not share this medicine with others. What if I miss a dose? It is  important not to miss your dose. Call your doctor or health care professional if you miss your dose. If you miss a dose due to an On-body Injector failure or leakage, a new dose should be administered as soon as possible using a single prefilled syringe for manual use. What may interact with this medicine? Interactions have not been studied. Give your health care provider a list of all the medicines, herbs, non-prescription drugs, or dietary supplements you use. Also tell them if you smoke, drink alcohol, or use illegal drugs. Some items may interact with your medicine. This list may not describe all possible interactions. Give your health care provider a list of all the medicines, herbs, non-prescription drugs, or dietary supplements you use. Also tell them if you smoke, drink alcohol, or use illegal drugs. Some items may interact with your medicine. What should I watch for while using this medicine? You may need blood work done while you are taking this medicine. If you are going to need a MRI, CT scan, or other procedure, tell your doctor that you are using this medicine (On-Body Injector only). What side effects may I notice from receiving this medicine? Side effects that you should report to your doctor or health care professional as soon as possible: -allergic reactions like skin rash, itching or hives, swelling of the face, lips, or tongue -dizziness -fever -pain, redness, or irritation at site where injected -pinpoint red spots on the skin -shortness of breath or breathing problems -stomach or side pain, or pain at the shoulder -swelling -tiredness -trouble passing urine Side effects that usually do not require medical attention (report to your doctor   or health care professional if they continue or are bothersome): -bone pain -muscle pain This list may not describe all possible side effects. Call your doctor for medical advice about side effects. You may report side effects to FDA at  1-800-FDA-1088. Where should I keep my medicine? Keep out of the reach of children. Store pre-filled syringes in a refrigerator between 2 and 8 degrees C (36 and 46 degrees F). Do not freeze. Keep in carton to protect from light. Throw away this medicine if it is left out of the refrigerator for more than 48 hours. Throw away any unused medicine after the expiration date. NOTE: This sheet is a summary. It may not cover all possible information. If you have questions about this medicine, talk to your doctor, pharmacist, or health care provider.  2015, [employer] Standard. (2013-11-24 16:14:05)  

## 2014-05-02 ENCOUNTER — Encounter: Payer: Self-pay | Admitting: Oncology

## 2014-05-02 ENCOUNTER — Ambulatory Visit: Payer: Self-pay

## 2014-05-02 ENCOUNTER — Other Ambulatory Visit: Payer: Self-pay | Admitting: Oncology

## 2014-05-02 ENCOUNTER — Telehealth: Payer: Self-pay | Admitting: Oncology

## 2014-05-02 ENCOUNTER — Ambulatory Visit (HOSPITAL_BASED_OUTPATIENT_CLINIC_OR_DEPARTMENT_OTHER): Payer: Self-pay | Admitting: Oncology

## 2014-05-02 ENCOUNTER — Ambulatory Visit (HOSPITAL_BASED_OUTPATIENT_CLINIC_OR_DEPARTMENT_OTHER): Payer: Self-pay

## 2014-05-02 ENCOUNTER — Other Ambulatory Visit (HOSPITAL_BASED_OUTPATIENT_CLINIC_OR_DEPARTMENT_OTHER): Payer: Self-pay

## 2014-05-02 VITALS — BP 106/72 | HR 98 | Temp 98.3°F | Resp 18 | Ht 70.0 in | Wt 171.3 lb

## 2014-05-02 DIAGNOSIS — C6291 Malignant neoplasm of right testis, unspecified whether descended or undescended: Secondary | ICD-10-CM

## 2014-05-02 DIAGNOSIS — C6290 Malignant neoplasm of unspecified testis, unspecified whether descended or undescended: Secondary | ICD-10-CM

## 2014-05-02 DIAGNOSIS — Z5111 Encounter for antineoplastic chemotherapy: Secondary | ICD-10-CM

## 2014-05-02 DIAGNOSIS — Z95828 Presence of other vascular implants and grafts: Secondary | ICD-10-CM

## 2014-05-02 LAB — CBC WITH DIFFERENTIAL/PLATELET
BASO%: 0.5 % (ref 0.0–2.0)
Basophils Absolute: 0.1 10*3/uL (ref 0.0–0.1)
EOS%: 0 % (ref 0.0–7.0)
Eosinophils Absolute: 0 10*3/uL (ref 0.0–0.5)
HEMATOCRIT: 29.6 % — AB (ref 38.4–49.9)
HGB: 9.8 g/dL — ABNORMAL LOW (ref 13.0–17.1)
LYMPH#: 1.8 10*3/uL (ref 0.9–3.3)
LYMPH%: 17 % (ref 14.0–49.0)
MCH: 27.2 pg (ref 27.2–33.4)
MCHC: 33.1 g/dL (ref 32.0–36.0)
MCV: 82 fL (ref 79.3–98.0)
MONO#: 0.1 10*3/uL (ref 0.1–0.9)
MONO%: 0.7 % (ref 0.0–14.0)
NEUT#: 8.6 10*3/uL — ABNORMAL HIGH (ref 1.5–6.5)
NEUT%: 81.8 % — ABNORMAL HIGH (ref 39.0–75.0)
Platelets: 182 10*3/uL (ref 140–400)
RBC: 3.61 10*6/uL — AB (ref 4.20–5.82)
RDW: 14.9 % — ABNORMAL HIGH (ref 11.0–14.6)
WBC: 10.5 10*3/uL — AB (ref 4.0–10.3)

## 2014-05-02 LAB — COMPREHENSIVE METABOLIC PANEL (CC13)
ALT: 30 U/L (ref 0–55)
ANION GAP: 8 meq/L (ref 3–11)
AST: 20 U/L (ref 5–34)
Albumin: 3.5 g/dL (ref 3.5–5.0)
Alkaline Phosphatase: 108 U/L (ref 40–150)
BUN: 17.9 mg/dL (ref 7.0–26.0)
CO2: 28 meq/L (ref 22–29)
Calcium: 9.1 mg/dL (ref 8.4–10.4)
Chloride: 98 mEq/L (ref 98–109)
Creatinine: 0.7 mg/dL (ref 0.7–1.3)
Glucose: 113 mg/dl (ref 70–140)
POTASSIUM: 4.2 meq/L (ref 3.5–5.1)
SODIUM: 133 meq/L — AB (ref 136–145)
TOTAL PROTEIN: 7.2 g/dL (ref 6.4–8.3)
Total Bilirubin: 0.46 mg/dL (ref 0.20–1.20)

## 2014-05-02 MED ORDER — SODIUM CHLORIDE 0.9 % IJ SOLN
10.0000 mL | INTRAMUSCULAR | Status: DC | PRN
  Administered 2014-05-02: 10 mL
  Filled 2014-05-02: qty 10

## 2014-05-02 MED ORDER — HEPARIN SOD (PORK) LOCK FLUSH 100 UNIT/ML IV SOLN
500.0000 [IU] | Freq: Once | INTRAVENOUS | Status: AC | PRN
  Administered 2014-05-02: 500 [IU]
  Filled 2014-05-02: qty 5

## 2014-05-02 MED ORDER — SODIUM CHLORIDE 0.9 % IJ SOLN
10.0000 mL | INTRAMUSCULAR | Status: DC | PRN
  Administered 2014-05-02: 10 mL via INTRAVENOUS
  Filled 2014-05-02: qty 10

## 2014-05-02 MED ORDER — PROCHLORPERAZINE MALEATE 10 MG PO TABS
10.0000 mg | ORAL_TABLET | Freq: Once | ORAL | Status: AC
  Administered 2014-05-02: 10 mg via ORAL

## 2014-05-02 MED ORDER — SODIUM CHLORIDE 0.9 % IV SOLN
30.0000 [IU] | Freq: Once | INTRAVENOUS | Status: AC
  Administered 2014-05-02: 30 [IU] via INTRAVENOUS
  Filled 2014-05-02: qty 10

## 2014-05-02 MED ORDER — PROCHLORPERAZINE MALEATE 10 MG PO TABS
ORAL_TABLET | ORAL | Status: AC
  Filled 2014-05-02: qty 1

## 2014-05-02 MED ORDER — SODIUM CHLORIDE 0.9 % IV SOLN
Freq: Once | INTRAVENOUS | Status: AC
  Administered 2014-05-02: 13:00:00 via INTRAVENOUS

## 2014-05-02 NOTE — Telephone Encounter (Signed)
gv adn printed appt sched and avs for pt for Sept OCT and NOV....gv pt barium

## 2014-05-02 NOTE — Patient Instructions (Signed)
[facility] Discharge Instructions for Patients Receiving Chemotherapy  Today you received the following chemotherapy agents: Bleomycin  To help prevent nausea and vomiting after your treatment, we encourage you to take your nausea medication as prescribed by your physician.    If you develop nausea and vomiting that is not controlled by your nausea medication, call the clinic.   BELOW ARE SYMPTOMS THAT SHOULD BE REPORTED IMMEDIATELY:  *FEVER GREATER THAN 100.5 F  *CHILLS WITH OR WITHOUT FEVER  NAUSEA AND VOMITING THAT IS NOT CONTROLLED WITH YOUR NAUSEA MEDICATION  *UNUSUAL SHORTNESS OF BREATH  *UNUSUAL BRUISING OR BLEEDING  TENDERNESS IN MOUTH AND THROAT WITH OR WITHOUT PRESENCE OF ULCERS  *URINARY PROBLEMS  *BOWEL PROBLEMS  UNUSUAL RASH Items with * indicate a potential emergency and should be followed up as soon as possible.  Feel free to call the clinic you have any questions or concerns. The clinic phone number is [phone].

## 2014-05-02 NOTE — Progress Notes (Signed)
Hematology and Oncology Follow Up Visit  NAPHTALI ZYWICKI [id number] 23-Oct-1980 33 y.o. 05/02/2014 12:13 PM   Principle Diagnosis: 33 year old gentleman with testicular cancer diagnosed in June of 2015. He presented with a large testicular mass and found to have a stage IIB disease with periaortic lymphadenopathy. 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   Prior Therapy: He is status post radical nephrectomy on 02/07/2014. The tumor measured 7.3 cm showed 10% embryonal and 80%,. He also 10% yolk sac tumor.  Current therapy: He is currently receiving BEP chemotherapy today is 9 of cycle 3.  Interim History:  Mr. Chestnut presents today for a followup visit. Since his last visit, he started cycle 3 of chemotherapy and completed day 1 through day 5 with some increased nausea. He has reported grade 2 nausea and occasional vomiting. He was able to eat and drink adequately throughout the process. In the last few days he have felt normal and is able to eat regular food. He did have mouth pain associated with chemotherapy that resolved with Magic mouthwash. He does have grade 1 fatigue which is resolving. He did not report any fevers at this time. He also received Neulasta on day 6 of this cycle. He does not report any chest pain or difficulty breathing. Did not report any cough or hemoptysis. He is not reporting any chills or sweats or weight loss. Does not report any constipation or diarrhea or hematochezia. Is on current frequency urgency or hesitancy. Does not report any skeletal complaints. Is not reporting any lymphadenopathy or petechiae. Review of systems unremarkable.  Medications: I have reviewed the patient's current medications.  Current Outpatient Prescriptions  Medication Sig Dispense Refill  . ibuprofen (ADVIL,MOTRIN) 200 MG tablet Take 600 mg by mouth every 6 (six) hours as needed for moderate pain.      [name] lidocaine-prilocaine  (EMLA) cream Apply 1 application topically as needed.  30 g  0  . prochlorperazine (COMPAZINE) 10 MG tablet Take 1 tablet (10 mg total) by mouth every 6 (six) hours as needed for nausea or vomiting.  30 tablet  0   No current facility-administered medications for this visit.     Allergies:  Allergies  Allergen Reactions  . Tegaderm Ag Mesh [Silver] Rash    Redness & irritation noted from IV site dressing 24 hrs after d/c'd     Past Medical History, Surgical history, Social history, and Family History were reviewed and updated.   Physical Exam: Blood pressure 106/72, pulse 98, temperature 98.3 F (36.8 C), temperature source Oral, resp. rate 18, height 5\' 10"  (1.778 m), weight 171 lb 4.8 oz (77.701 kg), SpO2 100.00%. ECOG: 0 General appearance: alert and cooperative Head: Normocephalic, without obvious abnormality Neck: no adenopathy Lymph nodes: Cervical, supraclavicular, and axillary nodes normal. Heart:regular rate and rhythm, S1, S2 normal, no murmur, click, rub or gallop Lung:chest clear, no wheezing, rales, normal symmetric air entry Abdomin: soft, non-tender, without masses or organomegaly EXT:no erythema, induration, or nodules Incision site is well healed.  Lab Results: Lab Results  Component Value Date   WBC 14.9* 04/24/2014   HGB 10.9* 04/24/2014   HCT 32.9* 04/24/2014   MCV 82.4 04/24/2014   PLT 219 04/24/2014     Chemistry      Component Value Date/Time   NA 139 04/24/2014 0829   NA 136 04/18/2014 0916   K 3.9 04/24/2014 0829   K 4.1 04/18/2014 0916   CL 97 04/18/2014  0916   CO2 26 04/24/2014 0829   CO2 25 04/18/2014 0916   BUN 7.5 04/24/2014 0829   BUN 8 04/18/2014 0916   CREATININE 0.8 04/24/2014 0829   CREATININE 0.71 04/18/2014 0916      Component Value Date/Time   CALCIUM 9.8 04/24/2014 0829   CALCIUM 9.6 04/18/2014 0916   ALKPHOS 77 04/24/2014 0829   ALKPHOS 81 04/18/2014 0916   AST 35* 04/24/2014 0829   AST 14 04/18/2014 0916   ALT 107* 04/24/2014 0829   ALT  26 04/18/2014 0916   BILITOT <0.20 04/24/2014 0829   BILITOT <0.2* 04/18/2014 0916         Impression and Plan:  33 year old gentleman with the following issues:  1. Testicular cancer diagnosed in June of 2015. He presented with a large right testicular mass and found to have a stage IIB disease. 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 He is receiving BEP chemotherapy without any complications. The plan is to proceed with day date 9 of cycle 3 of chemotherapy today. He will conclude chemotherapy on 05/09/2014 with day 16. He'll obtain a CT scan the next few months to assess response to therapy. He may require retroperitoneal lymph nodes dissection she has any residual teratoma.  2. IV access: He is ready to proceed with Port-A-Cath which will be inserted on 03/30/2014. This will be removed he has no residual disease.  3. Antiemetics: He has a prescription for Compazine which have helped his symptoms.  4. 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  5. Right groin cellulitis: Well-healed at this point.  6. Followup: 1 05/09/2014 for day 16 of cycle 3. He will obtain a CT scan in November followup at that time.    Zola Button, MD 8/25/201512:13 PM

## 2014-05-09 ENCOUNTER — Ambulatory Visit (HOSPITAL_BASED_OUTPATIENT_CLINIC_OR_DEPARTMENT_OTHER): Payer: Self-pay

## 2014-05-09 ENCOUNTER — Other Ambulatory Visit (HOSPITAL_BASED_OUTPATIENT_CLINIC_OR_DEPARTMENT_OTHER): Payer: Self-pay

## 2014-05-09 VITALS — BP 114/73 | HR 89 | Temp 97.3°F | Resp 18

## 2014-05-09 DIAGNOSIS — C6291 Malignant neoplasm of right testis, unspecified whether descended or undescended: Secondary | ICD-10-CM

## 2014-05-09 DIAGNOSIS — C6290 Malignant neoplasm of unspecified testis, unspecified whether descended or undescended: Secondary | ICD-10-CM

## 2014-05-09 DIAGNOSIS — Z5111 Encounter for antineoplastic chemotherapy: Secondary | ICD-10-CM

## 2014-05-09 LAB — CBC WITH DIFFERENTIAL/PLATELET
BASO%: 1.1 % (ref 0.0–2.0)
BASOS ABS: 0.2 10*3/uL — AB (ref 0.0–0.1)
EOS%: 0.2 % (ref 0.0–7.0)
Eosinophils Absolute: 0.1 10*3/uL (ref 0.0–0.5)
HEMATOCRIT: 29.2 % — AB (ref 38.4–49.9)
HEMOGLOBIN: 9.4 g/dL — AB (ref 13.0–17.1)
LYMPH#: 3.1 10*3/uL (ref 0.9–3.3)
LYMPH%: 15.1 % (ref 14.0–49.0)
MCH: 27.3 pg (ref 27.2–33.4)
MCHC: 32.2 g/dL (ref 32.0–36.0)
MCV: 84.9 fL (ref 79.3–98.0)
MONO#: 3.2 10*3/uL — ABNORMAL HIGH (ref 0.1–0.9)
MONO%: 15.3 % — ABNORMAL HIGH (ref 0.0–14.0)
NEUT#: 14.1 10*3/uL — ABNORMAL HIGH (ref 1.5–6.5)
NEUT%: 68.3 % (ref 39.0–75.0)
Platelets: 34 10*3/uL — ABNORMAL LOW (ref 140–400)
RBC: 3.44 10*6/uL — ABNORMAL LOW (ref 4.20–5.82)
RDW: 15.3 % — ABNORMAL HIGH (ref 11.0–14.6)
WBC: 20.7 10*3/uL — ABNORMAL HIGH (ref 4.0–10.3)

## 2014-05-09 LAB — COMPREHENSIVE METABOLIC PANEL (CC13)
ALT: 62 U/L — ABNORMAL HIGH (ref 0–55)
AST: 39 U/L — ABNORMAL HIGH (ref 5–34)
Albumin: 3.4 g/dL — ABNORMAL LOW (ref 3.5–5.0)
Alkaline Phosphatase: 95 U/L (ref 40–150)
Anion Gap: 8 mEq/L (ref 3–11)
BUN: 5.7 mg/dL — ABNORMAL LOW (ref 7.0–26.0)
CO2: 26 mEq/L (ref 22–29)
Calcium: 9.2 mg/dL (ref 8.4–10.4)
Chloride: 107 mEq/L (ref 98–109)
Creatinine: 0.8 mg/dL (ref 0.7–1.3)
Glucose: 105 mg/dl (ref 70–140)
Potassium: 3.9 mEq/L (ref 3.5–5.1)
Sodium: 141 mEq/L (ref 136–145)
Total Bilirubin: <0.2 mg/dL (ref 0.20–1.20)
Total Protein: 7.3 g/dL (ref 6.4–8.3)

## 2014-05-09 MED ORDER — PROCHLORPERAZINE MALEATE 10 MG PO TABS
ORAL_TABLET | ORAL | Status: AC
  Filled 2014-05-09: qty 1

## 2014-05-09 MED ORDER — SODIUM CHLORIDE 0.9 % IV SOLN
Freq: Once | INTRAVENOUS | Status: AC
  Administered 2014-05-09: 10:00:00 via INTRAVENOUS

## 2014-05-09 MED ORDER — HEPARIN SOD (PORK) LOCK FLUSH 100 UNIT/ML IV SOLN
500.0000 [IU] | Freq: Once | INTRAVENOUS | Status: AC | PRN
  Administered 2014-05-09: 500 [IU]
  Filled 2014-05-09: qty 5

## 2014-05-09 MED ORDER — SODIUM CHLORIDE 0.9 % IV SOLN
30.0000 [IU] | Freq: Once | INTRAVENOUS | Status: AC
  Administered 2014-05-09: 30 [IU] via INTRAVENOUS
  Filled 2014-05-09: qty 10

## 2014-05-09 MED ORDER — SODIUM CHLORIDE 0.9 % IJ SOLN
10.0000 mL | INTRAMUSCULAR | Status: DC | PRN
  Administered 2014-05-09: 10 mL
  Filled 2014-05-09: qty 10

## 2014-05-09 MED ORDER — PROCHLORPERAZINE MALEATE 10 MG PO TABS
10.0000 mg | ORAL_TABLET | Freq: Once | ORAL | Status: AC
  Administered 2014-05-09: 10 mg via ORAL

## 2014-05-09 NOTE — Progress Notes (Signed)
Ok to treat per Dr. Alen Blew.

## 2014-05-09 NOTE — Patient Instructions (Signed)
[facility] Discharge Instructions for Patients Receiving Chemotherapy  Today you received the following chemotherapy agents  Bleomycin  To help prevent nausea and vomiting after your treatment, we encourage you to take your nausea medication Compazine by mouth as directed if needed If you develop nausea and vomiting that is not controlled by your nausea medication, call the clinic.   BELOW ARE SYMPTOMS THAT SHOULD BE REPORTED IMMEDIATELY:  *FEVER GREATER THAN 100.5 F  *CHILLS WITH OR WITHOUT FEVER  NAUSEA AND VOMITING THAT IS NOT CONTROLLED WITH YOUR NAUSEA MEDICATION  *UNUSUAL SHORTNESS OF BREATH  *UNUSUAL BRUISING OR BLEEDING  TENDERNESS IN MOUTH AND THROAT WITH OR WITHOUT PRESENCE OF ULCERS  *URINARY PROBLEMS  *BOWEL PROBLEMS  UNUSUAL RASH Items with * indicate a potential emergency and should be followed up as soon as possible.  Feel free to call the clinic you have any questions or concerns. The clinic phone number is [phone].

## 2014-05-22 ENCOUNTER — Telehealth: Payer: Self-pay | Admitting: Dietician

## 2014-05-22 NOTE — Telephone Encounter (Signed)
Brief Outpatient Oncology Nutrition Note  Patient has been identified to be at risk on malnutrition screen.  Wt Readings from Last 10 Encounters:  05/02/14 171 lb 4.8 oz (77.701 kg)  04/11/14 174 lb 9.6 oz (79.198 kg)  03/28/14 177 lb 9.6 oz (80.559 kg)  03/21/14 178 lb 12.8 oz (81.103 kg)  02/22/14 182 lb 9.6 oz (82.827 kg)  02/07/14 176 lb (79.833 kg)  02/07/14 176 lb (79.833 kg)  01/25/14 185 lb (83.915 kg)  06/13/13 185 lb (83.915 kg)  10/11/08 170 lb (77.111 kg)    Dx:  Testicular cancer.  Patient of Dr, Benay Spice.  Called patient due to weight loss.  States that he is eating much better and nausea has improved and no longer requires the anti nausea medicines at this time.  Encouraged consistent regular meals.  Patient reports no needs at this time.  [place] RD remains available as needed.  Antonieta Iba, RD, LDN

## 2014-06-15 ENCOUNTER — Encounter: Payer: Self-pay | Admitting: Oncology

## 2014-06-15 NOTE — Progress Notes (Signed)
Called DSS in [place] and there is no app on file for patient. No medicaid. Prev notes say he applied.

## 2014-06-21 ENCOUNTER — Encounter: Payer: Self-pay | Admitting: Oncology

## 2014-06-21 NOTE — Progress Notes (Signed)
Per DSS no application for medicaid is on file for the patient. I will call him to let him know he must apply. All self pay

## 2014-06-27 ENCOUNTER — Encounter: Payer: Self-pay | Admitting: Oncology

## 2014-06-27 ENCOUNTER — Ambulatory Visit (HOSPITAL_BASED_OUTPATIENT_CLINIC_OR_DEPARTMENT_OTHER): Payer: Self-pay

## 2014-06-27 VITALS — BP 118/70 | HR 75 | Temp 97.7°F

## 2014-06-27 DIAGNOSIS — Z452 Encounter for adjustment and management of vascular access device: Secondary | ICD-10-CM

## 2014-06-27 DIAGNOSIS — Z95828 Presence of other vascular implants and grafts: Secondary | ICD-10-CM

## 2014-06-27 DIAGNOSIS — C6290 Malignant neoplasm of unspecified testis, unspecified whether descended or undescended: Secondary | ICD-10-CM

## 2014-06-27 MED ORDER — HEPARIN SOD (PORK) LOCK FLUSH 100 UNIT/ML IV SOLN
500.0000 [IU] | Freq: Once | INTRAVENOUS | Status: AC
  Administered 2014-06-27: 500 [IU] via INTRAVENOUS
  Filled 2014-06-27: qty 5

## 2014-06-27 MED ORDER — SODIUM CHLORIDE 0.9 % IJ SOLN
10.0000 mL | INTRAMUSCULAR | Status: DC | PRN
  Administered 2014-06-27: 10 mL via INTRAVENOUS
  Filled 2014-06-27: qty 10

## 2014-06-27 NOTE — Progress Notes (Signed)
Spoke with patient again and he said again will go and apply. He said he applied for disability and he go food stamps. I advised he wld be self pay until insurance is obtained.

## 2014-06-27 NOTE — Patient Instructions (Signed)

## 2014-07-25 ENCOUNTER — Ambulatory Visit (HOSPITAL_COMMUNITY)
Admission: RE | Admit: 2014-07-25 | Discharge: 2014-07-25 | Disposition: A | Discharge status: Home / Self Care | Payer: Self-pay | Source: Ambulatory Visit | Attending: Oncology | Admitting: Oncology

## 2014-07-25 ENCOUNTER — Ambulatory Visit (HOSPITAL_BASED_OUTPATIENT_CLINIC_OR_DEPARTMENT_OTHER): Payer: Self-pay

## 2014-07-25 ENCOUNTER — Encounter (HOSPITAL_COMMUNITY): Payer: Self-pay

## 2014-07-25 ENCOUNTER — Other Ambulatory Visit (HOSPITAL_BASED_OUTPATIENT_CLINIC_OR_DEPARTMENT_OTHER): Payer: Self-pay

## 2014-07-25 VITALS — BP 124/72 | HR 88 | Temp 98.3°F

## 2014-07-25 DIAGNOSIS — Z9221 Personal history of antineoplastic chemotherapy: Secondary | ICD-10-CM | POA: Insufficient documentation

## 2014-07-25 DIAGNOSIS — C6290 Malignant neoplasm of unspecified testis, unspecified whether descended or undescended: Secondary | ICD-10-CM

## 2014-07-25 DIAGNOSIS — M5126 Other intervertebral disc displacement, lumbar region: Secondary | ICD-10-CM | POA: Insufficient documentation

## 2014-07-25 DIAGNOSIS — Z95828 Presence of other vascular implants and grafts: Secondary | ICD-10-CM

## 2014-07-25 DIAGNOSIS — Z9079 Acquired absence of other genital organ(s): Secondary | ICD-10-CM | POA: Insufficient documentation

## 2014-07-25 DIAGNOSIS — Z452 Encounter for adjustment and management of vascular access device: Secondary | ICD-10-CM

## 2014-07-25 DIAGNOSIS — C6291 Malignant neoplasm of right testis, unspecified whether descended or undescended: Secondary | ICD-10-CM | POA: Insufficient documentation

## 2014-07-25 DIAGNOSIS — M5127 Other intervertebral disc displacement, lumbosacral region: Secondary | ICD-10-CM | POA: Insufficient documentation

## 2014-07-25 DIAGNOSIS — J9811 Atelectasis: Secondary | ICD-10-CM | POA: Insufficient documentation

## 2014-07-25 LAB — CBC WITH DIFFERENTIAL/PLATELET
BASO%: 0.6 % (ref 0.0–2.0)
BASOS ABS: 0 10*3/uL (ref 0.0–0.1)
EOS%: 2.6 % (ref 0.0–7.0)
Eosinophils Absolute: 0.1 10*3/uL (ref 0.0–0.5)
HCT: 37.9 % — ABNORMAL LOW (ref 38.4–49.9)
HEMOGLOBIN: 12.2 g/dL — AB (ref 13.0–17.1)
LYMPH#: 1.7 10*3/uL (ref 0.9–3.3)
LYMPH%: 43.7 % (ref 14.0–49.0)
MCH: 27.1 pg — AB (ref 27.2–33.4)
MCHC: 32.1 g/dL (ref 32.0–36.0)
MCV: 84.6 fL (ref 79.3–98.0)
MONO#: 0.1 10*3/uL (ref 0.1–0.9)
MONO%: 3.4 % (ref 0.0–14.0)
NEUT%: 49.7 % (ref 39.0–75.0)
NEUTROS ABS: 1.9 10*3/uL (ref 1.5–6.5)
Platelets: 159 10*3/uL (ref 140–400)
RBC: 4.48 10*6/uL (ref 4.20–5.82)
RDW: 13.3 % (ref 11.0–14.6)
WBC: 3.9 10*3/uL — ABNORMAL LOW (ref 4.0–10.3)

## 2014-07-25 LAB — COMPREHENSIVE METABOLIC PANEL (CC13)
ALT: 25 U/L (ref 0–55)
AST: 19 U/L (ref 5–34)
Albumin: 3.7 g/dL (ref 3.5–5.0)
Alkaline Phosphatase: 72 U/L (ref 40–150)
Anion Gap: 9 mEq/L (ref 3–11)
BUN: 13.4 mg/dL (ref 7.0–26.0)
CALCIUM: 9.8 mg/dL (ref 8.4–10.4)
CHLORIDE: 103 meq/L (ref 98–109)
CO2: 26 mEq/L (ref 22–29)
Creatinine: 0.9 mg/dL (ref 0.7–1.3)
Glucose: 97 mg/dl (ref 70–140)
POTASSIUM: 4.3 meq/L (ref 3.5–5.1)
Sodium: 138 mEq/L (ref 136–145)
Total Bilirubin: 0.23 mg/dL (ref 0.20–1.20)
Total Protein: 8.1 g/dL (ref 6.4–8.3)

## 2014-07-25 LAB — LACTATE DEHYDROGENASE (CC13): LDH: 146 U/L (ref 125–245)

## 2014-07-25 MED ORDER — IOHEXOL 300 MG/ML  SOLN
100.0000 mL | Freq: Once | INTRAMUSCULAR | Status: AC | PRN
  Administered 2014-07-25: 100 mL via INTRAVENOUS

## 2014-07-25 MED ORDER — SODIUM CHLORIDE 0.9 % IJ SOLN
10.0000 mL | INTRAMUSCULAR | Status: DC | PRN
  Administered 2014-07-25: 10 mL via INTRAVENOUS
  Filled 2014-07-25: qty 10

## 2014-07-25 MED ORDER — HEPARIN SOD (PORK) LOCK FLUSH 100 UNIT/ML IV SOLN
500.0000 [IU] | Freq: Once | INTRAVENOUS | Status: AC
  Administered 2014-07-25: 500 [IU] via INTRAVENOUS
  Filled 2014-07-25: qty 5

## 2014-07-25 NOTE — Patient Instructions (Signed)

## 2014-07-27 LAB — AFP TUMOR MARKER: AFP-Tumor Marker: 3.1 ng/mL (ref <6.1)

## 2014-07-27 LAB — AFP TUMOR MARKER-PREVIOUS METHOD: AFP Tumor Marker: 3.3 ng/mL (ref 0.0–8.0)

## 2014-07-27 LAB — BETA HCG QUANT (REF LAB): Beta hCG, Tumor Marker: <2 m[IU]/mL (ref <5.0)

## 2014-08-01 ENCOUNTER — Telehealth: Payer: Self-pay | Admitting: Oncology

## 2014-08-01 ENCOUNTER — Encounter: Payer: Self-pay | Admitting: Oncology

## 2014-08-01 ENCOUNTER — Ambulatory Visit (HOSPITAL_BASED_OUTPATIENT_CLINIC_OR_DEPARTMENT_OTHER): Payer: Self-pay | Admitting: Oncology

## 2014-08-01 VITALS — BP 123/77 | HR 87 | Temp 98.2°F | Resp 18 | Ht 70.0 in | Wt 176.2 lb

## 2014-08-01 DIAGNOSIS — C6291 Malignant neoplasm of right testis, unspecified whether descended or undescended: Secondary | ICD-10-CM

## 2014-08-01 NOTE — Progress Notes (Signed)
Hematology and Oncology Follow Up Visit  Jeremy Clay [id number] 07-Feb-1981 33 y.o. 08/01/2014 10:44 AM   Principle Diagnosis: 33 year old gentleman with testicular cancer diagnosed in June of 2015. He presented with a large testicular mass and found to have a stage IIB disease with periaortic lymphadenopathy. 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   Prior Therapy: He is status post radical nephrectomy on 02/07/2014. The tumor measured 7.3 cm showed 10% embryonal and 80%,. He also 10% yolk sac tumor. He is S/P BEP chemotherapy completed in September 2015.  Current therapy: Observation and surveillance.  Interim History:  Mr. Jeremy Clay presents today for a followup visit. Since his last visit, he completed cycle 3 of chemotherapy without any delayed complications. He has not reported any residual neuropathy, respiratory symptoms or any decline in his energy.[name] He did not report any fevers at this time.  He does not report any chest pain or difficulty breathing. Did not report any cough or hemoptysis. He is not reporting any chills or sweats or weight loss. Does not report any constipation or diarrhea or hematochezia. Is on current frequency urgency or hesitancy. Does not report any skeletal complaints. Is not reporting any lymphadenopathy or petechiae. Review of systems unremarkable.  Medications: I have reviewed the patient's current medications.  Current Outpatient Prescriptions  Medication Sig Dispense Refill  . ibuprofen (ADVIL,MOTRIN) 200 MG tablet Take 600 mg by mouth every 6 (six) hours as needed for moderate pain.    [name] lidocaine-prilocaine (EMLA) cream Apply 1 application topically as needed. 30 g 0  . prochlorperazine (COMPAZINE) 10 MG tablet Take 1 tablet (10 mg total) by mouth every 6 (six) hours as needed for nausea or vomiting. 30 tablet 0   No current facility-administered medications for this visit.      Allergies:  Allergies  Allergen Reactions  . Tegaderm Ag Mesh [Silver] Rash    Redness & irritation noted from IV site dressing 24 hrs after d/c'd     Past Medical History, Surgical history, Social history, and Family History were reviewed and updated.   Physical Exam: Height 5\' 10"  (1.778 m), weight 176 lb 3.2 oz (79.924 kg). ECOG: 0 General appearance: alert and cooperative Head: Normocephalic, without obvious abnormality Neck: no adenopathy Lymph nodes: Cervical, supraclavicular, and axillary nodes normal. Heart:regular rate and rhythm, S1, S2 normal, no murmur, click, rub or gallop Lung:chest clear, no wheezing, rales, normal symmetric air entry Abdomin: soft, non-tender, without masses or organomegaly EXT:no erythema, induration, or nodules Incision site is well healed.  Lab Results: Lab Results  Component Value Date   WBC 3.9* 07/25/2014   HGB 12.2* 07/25/2014   HCT 37.9* 07/25/2014   MCV 84.6 07/25/2014   PLT 159 07/25/2014     Chemistry      Component Value Date/Time   NA 138 07/25/2014 0856   NA 136 04/18/2014 0916   K 4.3 07/25/2014 0856   K 4.1 04/18/2014 0916   CL 97 04/18/2014 0916   CO2 26 07/25/2014 0856   CO2 25 04/18/2014 0916   BUN 13.4 07/25/2014 0856   BUN 8 04/18/2014 0916   CREATININE 0.9 07/25/2014 0856   CREATININE 0.71 04/18/2014 0916      Component Value Date/Time   CALCIUM 9.8 07/25/2014 0856   CALCIUM 9.6 04/18/2014 0916   ALKPHOS 72 07/25/2014 0856   ALKPHOS 81 04/18/2014 0916   AST 19 07/25/2014 0856   AST 14 04/18/2014 0916  ALT 25 07/25/2014 0856   ALT 26 04/18/2014 0916   BILITOT 0.23 07/25/2014 0856   BILITOT <0.2* 04/18/2014 0916      : CT CHEST, ABDOMEN, AND PELVIS WITH CONTRAST  TECHNIQUE: Multidetector CT imaging of the chest, abdomen and pelvis was performed following the standard protocol during bolus administration of intravenous contrast.  CONTRAST: 140mL OMNIPAQUE IOHEXOL 300 MG/ML  SOLN  COMPARISON: 02/02/2014  FINDINGS: CT CHEST FINDINGS  Small mediastinal lymph nodes are not pathologically enlarged by size criteria. Right-sided Port-A-Cath noted. There is a suggestion of a small filling defect along the distal end of the Port-A-Cath at the cavoatrial junction, possibly a small amount of thrombus or fibrin formation. Is the Port-A-Cath functioning appropriately? New subsegmental 1.9 by 1.3 cm airspace opacity in the left lower lobe, image 47 series 4.  CT ABDOMEN AND PELVIS FINDINGS  Hepatobiliary: Unremarkable  Pancreas: Unremarkable  Spleen: Unremarkable  Adrenals/Urinary Tract: Unremarkable  Stomach/Bowel: Unremarkable  Vascular/Lymphatic: Partially necrotic aortocaval lymph node measures 3.4 by 2.5 cm (formerly 3.6 by 2.8 cm). Measured on image 79 series 2. No other significant adenopathy observed.  Reproductive: Right orchectomy.  Other: No supplemental non-categorized findings.  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  IMPRESSION: 1. Mild reduction in size of the large necrotic aortocaval lymph node in the retroperitoneum. The lymph node does remain significantly abnormally enlarged overall. 2. Right orchectomy. 3. There is a suggestion of a filling defect along the distal end of the Port-A-Cath at the cavoatrial junction. This may represent a small amount of thrombus. Is the Port-A-Cath functioning appropriately? Consider contrast injection under fluoroscopy for Port-A-Cath check. 4. New 1.9 by 1.3 cm airspace opacity in the left lower lobe possibly from atelectasis or less likely early pneumonia. Correlate with any fever/leukocytosis.    Impression and Plan:  33 year old gentleman with the following issues:  1. Testicular cancer diagnosed in June of 2015. He presented with a large right testicular mass and found to have a  stage IIB disease. 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 He is status post 3 cycles of BEP chemotherapy completed in August 2015. His CT scan on 07/25/2014 was discussed today and showed a persistent large necrotic aortocaval lymph node in the retroperitoneum. Other lymphadenopathy or malignancy. His tumor markers are completely normal at this time. It is unclear whether this lymph node is indeed enlarged related to malignancy or teratoma that needs to be surgically resected. The plan is to repeat the scan in 4 months and if this lymph node continued to enlarge then I will refer him for surgical resection. If the lymph node gland appeared in the similar size lotion we'll continue to monitor closely as it could be benign. I will continue his active surveillance with repeat tumor markers in 2 months and repeat imaging studies in 4 months.  2. IV access: This will remain in place to the next CT scan and if it's clear, we will arrange for Port-A-Cath removal.  3. Antiemetics: He has a prescription for Compazine which have helped his symptoms.  4. 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  6. Followup: In 4 months after CT scan.    [facility], MD 11/24/201510:44 AM

## 2014-08-01 NOTE — Telephone Encounter (Signed)
gv adn prnted appt sched and avs for pt for Jan and March 2016....gv pt barium

## 2014-09-26 ENCOUNTER — Other Ambulatory Visit (HOSPITAL_BASED_OUTPATIENT_CLINIC_OR_DEPARTMENT_OTHER): Payer: Self-pay

## 2014-09-26 ENCOUNTER — Ambulatory Visit: Payer: Self-pay

## 2014-09-26 VITALS — BP 134/68 | HR 84 | Temp 97.9°F

## 2014-09-26 DIAGNOSIS — C6291 Malignant neoplasm of right testis, unspecified whether descended or undescended: Secondary | ICD-10-CM

## 2014-09-26 DIAGNOSIS — Z95828 Presence of other vascular implants and grafts: Secondary | ICD-10-CM

## 2014-09-26 LAB — COMPREHENSIVE METABOLIC PANEL (CC13)
ALK PHOS: 76 U/L (ref 40–150)
ALT: 17 U/L (ref 0–55)
AST: 17 U/L (ref 5–34)
Albumin: 3.8 g/dL (ref 3.5–5.0)
Anion Gap: 7 mEq/L (ref 3–11)
BUN: 14.1 mg/dL (ref 7.0–26.0)
CO2: 27 meq/L (ref 22–29)
CREATININE: 0.8 mg/dL (ref 0.7–1.3)
Calcium: 9 mg/dL (ref 8.4–10.4)
Chloride: 105 mEq/L (ref 98–109)
EGFR: >90 mL/min/{1.73_m2} (ref >90)
GLUCOSE: 110 mg/dL (ref 70–140)
Potassium: 4 mEq/L (ref 3.5–5.1)
Sodium: 139 mEq/L (ref 136–145)
TOTAL PROTEIN: 7.4 g/dL (ref 6.4–8.3)
Total Bilirubin: <0.2 mg/dL (ref 0.20–1.20)

## 2014-09-26 LAB — LACTATE DEHYDROGENASE (CC13): LDH: 162 U/L (ref 125–245)

## 2014-09-26 LAB — CBC WITH DIFFERENTIAL/PLATELET
BASO%: 1 % (ref 0.0–2.0)
Basophils Absolute: 0 10*3/uL (ref 0.0–0.1)
EOS%: 1.7 % (ref 0.0–7.0)
Eosinophils Absolute: 0.1 10*3/uL (ref 0.0–0.5)
HCT: 37.5 % — ABNORMAL LOW (ref 38.4–49.9)
HGB: 11.9 g/dL — ABNORMAL LOW (ref 13.0–17.1)
LYMPH%: 45.6 % (ref 14.0–49.0)
MCH: 25.7 pg — ABNORMAL LOW (ref 27.2–33.4)
MCHC: 31.7 g/dL — AB (ref 32.0–36.0)
MCV: 81.3 fL (ref 79.3–98.0)
MONO#: 0.5 10*3/uL (ref 0.1–0.9)
MONO%: 9 % (ref 0.0–14.0)
NEUT#: 2.2 10*3/uL (ref 1.5–6.5)
NEUT%: 42.7 % (ref 39.0–75.0)
Platelets: 158 10*3/uL (ref 140–400)
RBC: 4.61 10*6/uL (ref 4.20–5.82)
RDW: 13.7 % (ref 11.0–14.6)
WBC: 5 10*3/uL (ref 4.0–10.3)
lymph#: 2.3 10*3/uL (ref 0.9–3.3)

## 2014-09-26 MED ORDER — HEPARIN SOD (PORK) LOCK FLUSH 100 UNIT/ML IV SOLN
500.0000 [IU] | Freq: Once | INTRAVENOUS | Status: AC
  Administered 2014-09-26: 500 [IU] via INTRAVENOUS
  Filled 2014-09-26: qty 5

## 2014-09-26 MED ORDER — SODIUM CHLORIDE 0.9 % IJ SOLN
10.0000 mL | INTRAMUSCULAR | Status: DC | PRN
  Administered 2014-09-26: 10 mL via INTRAVENOUS
  Filled 2014-09-26: qty 10

## 2014-09-26 NOTE — Patient Instructions (Signed)

## 2014-09-27 LAB — AFP TUMOR MARKER-PREVIOUS METHOD: AFP Tumor Marker: 2.7 ng/mL (ref 0.0–8.0)

## 2014-09-27 LAB — BETA HCG QUANT (REF LAB): Beta hCG, Tumor Marker: <2 m[IU]/mL (ref <5.0)

## 2014-09-27 LAB — AFP TUMOR MARKER: AFP TUMOR MARKER: 3.5 ng/mL (ref <6.1)

## 2014-11-28 ENCOUNTER — Other Ambulatory Visit (HOSPITAL_BASED_OUTPATIENT_CLINIC_OR_DEPARTMENT_OTHER): Payer: Self-pay

## 2014-11-28 ENCOUNTER — Ambulatory Visit (HOSPITAL_COMMUNITY)
Admission: RE | Admit: 2014-11-28 | Discharge: 2014-11-28 | Disposition: A | Discharge status: Home / Self Care | Payer: Self-pay | Source: Ambulatory Visit | Attending: Oncology | Admitting: Oncology

## 2014-11-28 ENCOUNTER — Ambulatory Visit (HOSPITAL_BASED_OUTPATIENT_CLINIC_OR_DEPARTMENT_OTHER): Payer: Self-pay

## 2014-11-28 VITALS — BP 127/70 | HR 82 | Temp 97.8°F | Resp 18

## 2014-11-28 DIAGNOSIS — Z9889 Other specified postprocedural states: Secondary | ICD-10-CM | POA: Insufficient documentation

## 2014-11-28 DIAGNOSIS — C6291 Malignant neoplasm of right testis, unspecified whether descended or undescended: Secondary | ICD-10-CM

## 2014-11-28 DIAGNOSIS — Z95828 Presence of other vascular implants and grafts: Secondary | ICD-10-CM

## 2014-11-28 LAB — CBC WITH DIFFERENTIAL/PLATELET
BASO%: 0.4 % (ref 0.0–2.0)
Basophils Absolute: 0 10*3/uL (ref 0.0–0.1)
EOS%: 2 % (ref 0.0–7.0)
Eosinophils Absolute: 0.1 10*3/uL (ref 0.0–0.5)
HCT: 41.1 % (ref 38.4–49.9)
HEMOGLOBIN: 13.3 g/dL (ref 13.0–17.1)
LYMPH%: 46.3 % (ref 14.0–49.0)
MCH: 27 pg — AB (ref 27.2–33.4)
MCHC: 32.4 g/dL (ref 32.0–36.0)
MCV: 83.4 fL (ref 79.3–98.0)
MONO#: 0.4 10*3/uL (ref 0.1–0.9)
MONO%: 7.2 % (ref 0.0–14.0)
NEUT#: 2.2 10*3/uL (ref 1.5–6.5)
NEUT%: 44.1 % (ref 39.0–75.0)
Platelets: 146 10*3/uL (ref 140–400)
RBC: 4.93 10*6/uL (ref 4.20–5.82)
RDW: 14 % (ref 11.0–14.6)
WBC: 5 10*3/uL (ref 4.0–10.3)
lymph#: 2.3 10*3/uL (ref 0.9–3.3)

## 2014-11-28 LAB — COMPREHENSIVE METABOLIC PANEL (CC13)
ALBUMIN: 4.1 g/dL (ref 3.5–5.0)
ALT: 22 U/L (ref 0–55)
ANION GAP: 10 meq/L (ref 3–11)
AST: 18 U/L (ref 5–34)
Alkaline Phosphatase: 75 U/L (ref 40–150)
BUN: 12.9 mg/dL (ref 7.0–26.0)
CHLORIDE: 101 meq/L (ref 98–109)
CO2: 24 meq/L (ref 22–29)
Calcium: 9.4 mg/dL (ref 8.4–10.4)
Creatinine: 0.8 mg/dL (ref 0.7–1.3)
EGFR: >90 mL/min/{1.73_m2} (ref >90)
Glucose: 98 mg/dl (ref 70–140)
Potassium: 4.5 mEq/L (ref 3.5–5.1)
SODIUM: 136 meq/L (ref 136–145)
TOTAL PROTEIN: 8.3 g/dL (ref 6.4–8.3)
Total Bilirubin: 0.26 mg/dL (ref 0.20–1.20)

## 2014-11-28 LAB — LACTATE DEHYDROGENASE (CC13): LDH: 159 U/L (ref 125–245)

## 2014-11-28 MED ORDER — HEPARIN SOD (PORK) LOCK FLUSH 100 UNIT/ML IV SOLN
500.0000 [IU] | Freq: Once | INTRAVENOUS | Status: AC
  Administered 2014-11-28: 500 [IU] via INTRAVENOUS
  Filled 2014-11-28: qty 5

## 2014-11-28 MED ORDER — SODIUM CHLORIDE 0.9 % IJ SOLN
10.0000 mL | INTRAMUSCULAR | Status: DC | PRN
  Administered 2014-11-28: 10 mL via INTRAVENOUS
  Filled 2014-11-28: qty 10

## 2014-11-28 MED ORDER — IOHEXOL 300 MG/ML  SOLN
100.0000 mL | Freq: Once | INTRAMUSCULAR | Status: AC | PRN
  Administered 2014-11-28: 100 mL via INTRAVENOUS

## 2014-11-28 NOTE — Patient Instructions (Signed)

## 2014-11-30 ENCOUNTER — Encounter: Payer: Self-pay | Admitting: *Deleted

## 2014-11-30 ENCOUNTER — Telehealth: Payer: Self-pay | Admitting: Oncology

## 2014-11-30 ENCOUNTER — Ambulatory Visit (HOSPITAL_BASED_OUTPATIENT_CLINIC_OR_DEPARTMENT_OTHER): Payer: Self-pay | Admitting: Oncology

## 2014-11-30 VITALS — BP 109/73 | HR 85 | Temp 98.4°F | Resp 18 | Ht 70.0 in | Wt 181.2 lb

## 2014-11-30 DIAGNOSIS — C6291 Malignant neoplasm of right testis, unspecified whether descended or undescended: Secondary | ICD-10-CM

## 2014-11-30 LAB — BETA HCG QUANT (REF LAB)

## 2014-11-30 LAB — AFP TUMOR MARKER: AFP TUMOR MARKER: 3.2 ng/mL (ref <6.1)

## 2014-11-30 NOTE — Progress Notes (Signed)
Hematology and Oncology Follow Up Visit  Jeremy Clay [id number] 27-Sep-1980 34 y.o. 11/30/2014 10:10 AM   Principle Diagnosis: 34 year old gentleman with testicular cancer diagnosed in June of 2015. He presented with a large testicular mass and found to have a stage IIB disease with periaortic lymphadenopathy. 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   Prior Therapy: He is status post radical orchiectomy on 02/07/2014. The tumor measured 7.3 cm showed 10% embryonal, and 80% teratoma and 10% yolk sac tumor. He is S/P BEP chemotherapy for 3 cycles completed in September 2015.  Current therapy: Observation and surveillance and he is under evaluation for RPLND.  Interim History:  Jeremy Clay presents today for a followup visit. Since his last visit, he continues to do very well. He has not reported any residual neuropathy, respiratory symptoms or any decline in his energy. He has resumed activities of daily living as well as working full time. He did not report any fevers at this time.  He does not report any chest pain or difficulty breathing. Did not report any cough or hemoptysis. He is not reporting any chills or sweats or weight loss. Does not report any constipation or diarrhea or hematochezia. Is on current frequency urgency or hesitancy. Does not report any skeletal complaints. Is not reporting any lymphadenopathy or petechiae. Review of systems unremarkable.  Medications: I have reviewed the patient's current medications.  Current Outpatient Prescriptions  Medication Sig Dispense Refill  . ibuprofen (ADVIL,MOTRIN) 200 MG tablet Take 600 mg by mouth every 6 (six) hours as needed for moderate pain.    [name] lidocaine-prilocaine (EMLA) cream Apply 1 application topically as needed. (Patient not taking: Reported on 11/30/2014) 30 g 0  . prochlorperazine (COMPAZINE) 10 MG tablet Take 1 tablet (10 mg total) by mouth every 6 (six) hours  as needed for nausea or vomiting. (Patient not taking: Reported on 11/30/2014) 30 tablet 0   No current facility-administered medications for this visit.     Allergies:  Allergies  Allergen Reactions  . Tegaderm Ag Mesh [Silver] Rash    Redness & irritation noted from IV site dressing 24 hrs after d/c'd     Past Medical History, Surgical history, Social history, and Family History were reviewed and updated.   Physical Exam: Blood pressure 109/73, pulse 85, temperature 98.4 F (36.9 C), temperature source Oral, resp. rate 18, height 5\' 10"  (1.778 m), weight 181 lb 3.2 oz (82.192 kg), SpO2 100 %. ECOG: 0 General appearance: alert and cooperative not in any distress.  Head: Normocephalic, without obvious abnormality Neck: no adenopathy Lymph nodes: Cervical, supraclavicular, and axillary nodes normal. Heart:regular rate and rhythm, S1, S2 normal, no murmur, click, rub or gallop Lung:chest clear, no wheezing, rales, normal symmetric air entry Abdomin: soft, non-tender, without masses or organomegaly EXT:no erythema, induration, or nodules   Lab Results: Lab Results  Component Value Date   WBC 5.0 11/28/2014   HGB 13.3 11/28/2014   HCT 41.1 11/28/2014   MCV 83.4 11/28/2014   PLT 146 11/28/2014     Chemistry      Component Value Date/Time   NA 136 11/28/2014 0918   NA 136 04/18/2014 0916   K 4.5 11/28/2014 0918   K 4.1 04/18/2014 0916   CL 97 04/18/2014 0916   CO2 24 11/28/2014 0918   CO2 25 04/18/2014 0916   BUN 12.9 11/28/2014 0918   BUN 8 04/18/2014 0916   CREATININE 0.8 11/28/2014 2774  CREATININE 0.71 04/18/2014 0916      Component Value Date/Time   CALCIUM 9.4 11/28/2014 0918   CALCIUM 9.6 04/18/2014 0916   ALKPHOS 75 11/28/2014 0918   ALKPHOS 81 04/18/2014 0916   AST 18 11/28/2014 0918   AST 14 04/18/2014 0916   ALT 22 11/28/2014 0918   ALT 26 04/18/2014 0916   BILITOT 0.26 11/28/2014 0918   BILITOT <0.2* 04/18/2014 0916      CLINICAL DATA:  Subsequent evaluation of a 34 year old male with history of testicular cancer diagnosed in 2015 status post right-sided orchiectomy. Restaging examination.  EXAM: CT ABDOMEN AND PELVIS WITH CONTRAST  TECHNIQUE: Multidetector CT imaging of the abdomen and pelvis was performed using the standard protocol following bolus administration of intravenous contrast.  CONTRAST: 143mL OMNIPAQUE IOHEXOL 300 MG/ML SOLN  COMPARISON: CT of the chest, abdomen and pelvis 07/25/2014.  FINDINGS: Lower chest: Unremarkable. Notably, the previously described airspace disease in the periphery of the left lower lobe has completely resolved, with a small area of scarring in its wake.  Hepatobiliary: No cystic or solid hepatic lesions. No intra or extrahepatic biliary ductal dilatation gallbladder is normal in appearance.  Pancreas: Unremarkable.  Spleen: Unremarkable.  Adrenals/Urinary Tract: Bilateral adrenal glands and bilateral kidneys are normal in appearance. No hydroureteronephrosis. Urinary bladder is normal in appearance.  Stomach/Bowel: Stomach is normal in appearance. No pathologic dilatation of small bowel or colon. Normal appendix.  Vascular/Lymphatic: No significant atherosclerotic disease, aneurysm or dissection identified in the abdominal or pelvic vasculature. Enlarged aortocaval retroperitoneal lymph node measuring 3.3 x 2.1 cm (image 40 of series 2), slightly smaller than the prior examination. No other lymphadenopathy noted.  Reproductive: Status post right orchiectomy. Prostate gland and seminal vesicles are unremarkable in appearance.  Other: No significant volume of ascites. No pneumoperitoneum.  Musculoskeletal: There are no aggressive appearing lytic or blastic lesions noted in the visualized portions of the skeleton.  IMPRESSION: 1. Slight interval decreased size of aortocaval lymph node, which currently measures 3.3 x 2.1 cm. No other signs of  new metastatic disease noted elsewhere in the abdomen or pelvis. 2. Normal appendix.    Impression and Plan:  34 year old gentleman with the following issues:  1. Testicular cancer diagnosed in June of 2015. He presented with a large right testicular mass and found to have a stage IIB disease. 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 He is status post 3 cycles of BEP chemotherapy completed in 05/2014.   His CT scan on 11/28/2014 was discussed today and showed a large necrotic aortocaval lymph node measuring 3.3 x 2.1 cm. His tumor markers are completely normal at this time.   Given the persistent presence of this lymph node in the setting of a tumor that is 80% teratoma, it is very likely that we are dealing with a residual tumor. I feel his best option is to undergo RPLND to ensure there is no other residual viable tumor as well as resection of any teratoma that remains in place. I discussed this operation with him briefly today and is agreeable by principle. I will refer him to Dr. Dimas Millin or colleagues at [facility] for an evaluation. He will likely need imaging studies in 6 months.  2. IV access: This will remain in place. I anticipate removing his Port-A-Cath after his next set of CT scans as he are clear.   3. 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  4. Followup: In 4 months.     [facility], MD 3/24/201610:10 AM

## 2014-11-30 NOTE — Progress Notes (Signed)
Spoke with dora, new patient coordinator ar dr Rogelia Rohrer office /w [facility]. [phone]. Gave pertinent info, faxed pathology slide request to [facility]. slides to be fed-exed  To [facility]. They  have epic also, and may obtain records, from care everywhere. She will call me with date and time of new patient appt.

## 2014-11-30 NOTE — Telephone Encounter (Signed)
Gave avs & calendar for May/July. °

## 2014-12-01 ENCOUNTER — Encounter: Payer: Self-pay | Admitting: *Deleted

## 2014-12-01 NOTE — Progress Notes (Unsigned)
Spoke with vanda in radiology, she will fed-ex all films and scans on a CD to dora N.P. Coordinator at [facility] [employer]

## 2014-12-05 ENCOUNTER — Telehealth: Payer: Self-pay | Admitting: Oncology

## 2014-12-05 NOTE — Telephone Encounter (Signed)
Sydell Axon, New Patient Coordinator, Dr. Rogelia Rohrer office at [facility] called to clarify reason for referral.  Reported to Sydell Axon based on progress note from Dr. Alen Blew on 11-30-14 patient needs a surgical referral not medical oncology.  Sydell Axon noted patient is self pay and will need approval from [employer].

## 2014-12-05 NOTE — Telephone Encounter (Signed)
Lm on patient's answering machine to call me re: his referral to [facility].

## 2014-12-06 ENCOUNTER — Encounter: Payer: Self-pay | Admitting: *Deleted

## 2014-12-06 NOTE — Progress Notes (Signed)
Debbora Presto the phone number of dora the new patient coordinator at [facility]. So he may get instructions on what to do.

## 2015-01-24 ENCOUNTER — Telehealth: Payer: Self-pay | Admitting: Oncology

## 2015-01-24 NOTE — Telephone Encounter (Signed)
Pt called to r/s flush confirmed D/T for flush.... Jeremy Clay

## 2015-01-29 ENCOUNTER — Ambulatory Visit (HOSPITAL_BASED_OUTPATIENT_CLINIC_OR_DEPARTMENT_OTHER): Payer: Self-pay

## 2015-01-29 DIAGNOSIS — Z452 Encounter for adjustment and management of vascular access device: Secondary | ICD-10-CM

## 2015-01-29 DIAGNOSIS — C6291 Malignant neoplasm of right testis, unspecified whether descended or undescended: Secondary | ICD-10-CM

## 2015-01-29 MED ORDER — SODIUM CHLORIDE 0.9 % IJ SOLN
10.0000 mL | INTRAMUSCULAR | Status: DC | PRN
  Administered 2015-01-29: 10 mL via INTRAVENOUS
  Filled 2015-01-29: qty 10

## 2015-01-29 MED ORDER — HEPARIN SOD (PORK) LOCK FLUSH 100 UNIT/ML IV SOLN
500.0000 [IU] | Freq: Once | INTRAVENOUS | Status: AC
  Administered 2015-01-29: 500 [IU] via INTRAVENOUS
  Filled 2015-01-29: qty 5

## 2015-01-29 NOTE — Patient Instructions (Signed)

## 2015-03-29 ENCOUNTER — Ambulatory Visit (HOSPITAL_BASED_OUTPATIENT_CLINIC_OR_DEPARTMENT_OTHER): Payer: Self-pay | Admitting: Oncology

## 2015-03-29 ENCOUNTER — Ambulatory Visit (HOSPITAL_BASED_OUTPATIENT_CLINIC_OR_DEPARTMENT_OTHER): Payer: Self-pay

## 2015-03-29 ENCOUNTER — Other Ambulatory Visit (HOSPITAL_BASED_OUTPATIENT_CLINIC_OR_DEPARTMENT_OTHER): Payer: Self-pay

## 2015-03-29 ENCOUNTER — Telehealth: Payer: Self-pay | Admitting: Oncology

## 2015-03-29 VITALS — BP 128/72 | HR 80 | Temp 97.8°F | Wt 176.3 lb

## 2015-03-29 DIAGNOSIS — Z95828 Presence of other vascular implants and grafts: Secondary | ICD-10-CM

## 2015-03-29 DIAGNOSIS — C6291 Malignant neoplasm of right testis, unspecified whether descended or undescended: Secondary | ICD-10-CM

## 2015-03-29 LAB — CBC WITH DIFFERENTIAL/PLATELET
BASO%: 0.8 % (ref 0.0–2.0)
Basophils Absolute: 0.1 10*3/uL (ref 0.0–0.1)
EOS%: 1.2 % (ref 0.0–7.0)
Eosinophils Absolute: 0.1 10*3/uL (ref 0.0–0.5)
HEMATOCRIT: 36.5 % — AB (ref 38.4–49.9)
HGB: 12.1 g/dL — ABNORMAL LOW (ref 13.0–17.1)
LYMPH#: 2.6 10*3/uL (ref 0.9–3.3)
LYMPH%: 37.7 % (ref 14.0–49.0)
MCH: 27.3 pg (ref 27.2–33.4)
MCHC: 33.1 g/dL (ref 32.0–36.0)
MCV: 82.5 fL (ref 79.3–98.0)
MONO#: 0.4 10*3/uL (ref 0.1–0.9)
MONO%: 5.5 % (ref 0.0–14.0)
NEUT#: 3.8 10*3/uL (ref 1.5–6.5)
NEUT%: 54.8 % (ref 39.0–75.0)
Platelets: 157 10*3/uL (ref 140–400)
RBC: 4.42 10*6/uL (ref 4.20–5.82)
RDW: 13.7 % (ref 11.0–14.6)
WBC: 7 10*3/uL (ref 4.0–10.3)

## 2015-03-29 LAB — COMPREHENSIVE METABOLIC PANEL (CC13)
ALBUMIN: 4.1 g/dL (ref 3.5–5.0)
ALK PHOS: 70 U/L (ref 40–150)
ALT: 20 U/L (ref 0–55)
ANION GAP: 8 meq/L (ref 3–11)
AST: 19 U/L (ref 5–34)
BUN: 18.6 mg/dL (ref 7.0–26.0)
CO2: 26 mEq/L (ref 22–29)
Calcium: 9.3 mg/dL (ref 8.4–10.4)
Chloride: 106 mEq/L (ref 98–109)
Creatinine: 0.9 mg/dL (ref 0.7–1.3)
EGFR: >90 mL/min/{1.73_m2} (ref >90)
GLUCOSE: 132 mg/dL (ref 70–140)
Potassium: 3.9 mEq/L (ref 3.5–5.1)
SODIUM: 140 meq/L (ref 136–145)
TOTAL PROTEIN: 7.9 g/dL (ref 6.4–8.3)
Total Bilirubin: 0.32 mg/dL (ref 0.20–1.20)

## 2015-03-29 LAB — LACTATE DEHYDROGENASE (CC13): LDH: 196 U/L (ref 125–245)

## 2015-03-29 MED ORDER — SODIUM CHLORIDE 0.9 % IJ SOLN
10.0000 mL | INTRAMUSCULAR | Status: DC | PRN
Start: 2015-03-29 — End: 2015-03-29
  Administered 2015-03-29: 10 mL via INTRAVENOUS
  Filled 2015-03-29: qty 10

## 2015-03-29 MED ORDER — HEPARIN SOD (PORK) LOCK FLUSH 100 UNIT/ML IV SOLN
500.0000 [IU] | Freq: Once | INTRAVENOUS | Status: AC
  Administered 2015-03-29: 500 [IU] via INTRAVENOUS
  Filled 2015-03-29: qty 5

## 2015-03-29 NOTE — Patient Instructions (Signed)

## 2015-03-29 NOTE — Telephone Encounter (Signed)
per pof to sch pt appt-pt stated will come back and get contrast(riding a motorcycle)-gave pt copy of avs-adv [facility] will call and sch scan-

## 2015-03-29 NOTE — Progress Notes (Signed)
Hematology and Oncology Follow Up Visit  Jeremy Clay [id number] 11/20/1980 34 y.o. 03/29/2015 2:50 PM   Principle Diagnosis: 34 year old gentleman with testicular cancer diagnosed in June of 2015. He presented with a large testicular mass and found to have a stage IIB disease with periaortic lymphadenopathy. 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   Prior Therapy: He is status post radical orchiectomy on 02/07/2014. The tumor measured 7.3 cm showed 10% embryonal, and 80% teratoma and 10% yolk sac tumor. He is S/P BEP chemotherapy for 3 cycles completed in September 2015.  Current therapy: Observation and surveillance and he is under evaluation for RPLND.  Interim History:  Mr. Jallow presents today for a followup visit. Since his last visit, he was referred to [facility] for an evaluation for RPLND but has not been seen there yet. Clinically, he continues to do very well. He has not reported any residual neuropathy, respiratory symptoms or any decline in his energy. He has resumed activities of daily living as well as working full time. He did not report any fevers at this time.  He does not report any chest pain or difficulty breathing. Did not report any cough or hemoptysis. He is not reporting any chills or sweats or weight loss. Does not report any constipation or diarrhea or hematochezia. Is on current frequency urgency or hesitancy. Does not report any skeletal complaints. Is not reporting any lymphadenopathy or petechiae. Review of systems unremarkable.  Medications: I have reviewed the patient's current medications.  Current Outpatient Prescriptions  Medication Sig Dispense Refill  . ibuprofen (ADVIL,MOTRIN) 200 MG tablet Take 600 mg by mouth every 6 (six) hours as needed for moderate pain.    [name] lidocaine-prilocaine (EMLA) cream Apply 1 application topically as needed. (Patient not taking: Reported  on 03/29/2015) 30 g 0  . prochlorperazine (COMPAZINE) 10 MG tablet Take 1 tablet (10 mg total) by mouth every 6 (six) hours as needed for nausea or vomiting. (Patient not taking: Reported on 03/29/2015) 30 tablet 0   No current facility-administered medications for this visit.   Facility-Administered Medications Ordered in Other Visits  Medication Dose Route Frequency Provider Last Rate Last Dose  . sodium chloride 0.9 % injection 10 mL  10 mL Intravenous PRN Wyatt Portela, MD   10 mL at 03/29/15 1437     Allergies:  Allergies  Allergen Reactions  . Tegaderm Ag Mesh [Silver] Rash    Redness & irritation noted from IV site dressing 24 hrs after d/c'd     Past Medical History, Surgical history, Social history, and Family History were reviewed and updated.   Physical Exam: Vital show blood pressure 128/72, pulse of 80 temperature is 97.9 ECOG: 0 General appearance: alert and cooperative not in any distress.  Head: Normocephalic, without obvious abnormality Neck: no adenopathy Lymph nodes: Cervical, supraclavicular, and axillary nodes normal. Heart:regular rate and rhythm, S1, S2 normal, no murmur, click, rub or gallop Lung:chest clear, no wheezing, rales, normal symmetric air entry Abdomin: soft, non-tender, without masses or organomegaly EXT:no erythema, induration, or nodules   Lab Results: Lab Results  Component Value Date   WBC 7.0 03/29/2015   HGB 12.1* 03/29/2015   HCT 36.5* 03/29/2015   MCV 82.5 03/29/2015   PLT 157 03/29/2015     Chemistry      Component Value Date/Time   NA 136 11/28/2014 0918   NA 136 04/18/2014 0916   K 4.5 11/28/2014  0918   K 4.1 04/18/2014 0916   CL 97 04/18/2014 0916   CO2 24 11/28/2014 0918   CO2 25 04/18/2014 0916   BUN 12.9 11/28/2014 0918   BUN 8 04/18/2014 0916   CREATININE 0.8 11/28/2014 0918   CREATININE 0.71 04/18/2014 0916      Component Value Date/Time   CALCIUM 9.4 11/28/2014 0918   CALCIUM 9.6 04/18/2014 0916    ALKPHOS 75 11/28/2014 0918   ALKPHOS 81 04/18/2014 0916   AST 18 11/28/2014 0918   AST 14 04/18/2014 0916   ALT 22 11/28/2014 0918   ALT 26 04/18/2014 0916   BILITOT 0.26 11/28/2014 0918   BILITOT <0.2* 04/18/2014 0916       Impression and Plan:  34 year old gentleman with the following issues:  1. Testicular cancer diagnosed in June of 2015. He presented with a large right testicular mass and found to have a stage IIB disease. 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 He is status post 3 cycles of BEP chemotherapy completed in 05/2014.   His CT scan on 11/28/2014  showed a large necrotic aortocaval lymph node measuring 3.3 x 2.1 cm. His tumor markers are completely normal at this time.   Given the persistent presence of this lymph node in the setting of a tumor that is 80% teratoma, it is very likely that we are dealing with a residual tumor. He has been referred to [facility] for evaluation but has not been seen there yet. A referral has been made and he is encouraged to make that appointment in the near future. The meantime we'll continue active surveillance and repeat imaging studies in 3 months.  2. IV access: This will remain in place.   3. 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  4. Followup: In 3 months.     Zola Button, MD 7/21/20162:50 PM

## 2015-04-01 LAB — AFP TUMOR MARKER: AFP TUMOR MARKER: 2.7 ng/mL (ref <6.1)

## 2015-04-01 LAB — BETA HCG QUANT (REF LAB)

## 2015-05-10 ENCOUNTER — Ambulatory Visit (HOSPITAL_BASED_OUTPATIENT_CLINIC_OR_DEPARTMENT_OTHER): Payer: Self-pay

## 2015-05-10 DIAGNOSIS — C6291 Malignant neoplasm of right testis, unspecified whether descended or undescended: Secondary | ICD-10-CM

## 2015-05-10 DIAGNOSIS — Z95828 Presence of other vascular implants and grafts: Secondary | ICD-10-CM

## 2015-05-10 DIAGNOSIS — Z452 Encounter for adjustment and management of vascular access device: Secondary | ICD-10-CM

## 2015-05-10 MED ORDER — HEPARIN SOD (PORK) LOCK FLUSH 100 UNIT/ML IV SOLN
500.0000 [IU] | Freq: Once | INTRAVENOUS | Status: AC
  Administered 2015-05-10: 500 [IU] via INTRAVENOUS
  Filled 2015-05-10: qty 5

## 2015-05-10 MED ORDER — SODIUM CHLORIDE 0.9 % IJ SOLN
10.0000 mL | INTRAMUSCULAR | Status: DC | PRN
  Administered 2015-05-10: 10 mL via INTRAVENOUS
  Filled 2015-05-10: qty 10

## 2015-05-10 NOTE — Patient Instructions (Signed)

## 2015-06-19 ENCOUNTER — Emergency Department (HOSPITAL_COMMUNITY)
Admission: EM | Admit: 2015-06-19 | Discharge: 2015-06-19 | Disposition: A | Discharge status: Home / Self Care | Payer: Self-pay | Attending: Emergency Medicine | Admitting: Emergency Medicine

## 2015-06-19 ENCOUNTER — Encounter (HOSPITAL_COMMUNITY): Payer: Self-pay | Admitting: Emergency Medicine

## 2015-06-19 DIAGNOSIS — Z720 Tobacco use: Secondary | ICD-10-CM | POA: Insufficient documentation

## 2015-06-19 DIAGNOSIS — K0889 Other specified disorders of teeth and supporting structures: Secondary | ICD-10-CM | POA: Insufficient documentation

## 2015-06-19 DIAGNOSIS — Z8547 Personal history of malignant neoplasm of testis: Secondary | ICD-10-CM | POA: Insufficient documentation

## 2015-06-19 DIAGNOSIS — K029 Dental caries, unspecified: Secondary | ICD-10-CM | POA: Insufficient documentation

## 2015-06-19 DIAGNOSIS — M549 Dorsalgia, unspecified: Secondary | ICD-10-CM | POA: Insufficient documentation

## 2015-06-19 DIAGNOSIS — Z8739 Personal history of other diseases of the musculoskeletal system and connective tissue: Secondary | ICD-10-CM | POA: Insufficient documentation

## 2015-06-19 MED ORDER — IBUPROFEN 800 MG PO TABS
800.0000 mg | ORAL_TABLET | Freq: Once | ORAL | Status: AC
  Administered 2015-06-19: 800 mg via ORAL
  Filled 2015-06-19: qty 1

## 2015-06-19 MED ORDER — ACETAMINOPHEN 325 MG PO TABS
650.0000 mg | ORAL_TABLET | Freq: Once | ORAL | Status: AC
  Administered 2015-06-19: 650 mg via ORAL
  Filled 2015-06-19: qty 2

## 2015-06-19 MED ORDER — IBUPROFEN 800 MG PO TABS: 800.0000 mg | ORAL_TABLET | Freq: Three times a day (TID) | ORAL | Status: DC

## 2015-06-19 MED ORDER — CLINDAMYCIN HCL 150 MG PO CAPS: 150.0000 mg | ORAL_CAPSULE | Freq: Four times a day (QID) | ORAL | Status: DC

## 2015-06-19 MED ORDER — CLINDAMYCIN HCL 150 MG PO CAPS
300.0000 mg | ORAL_CAPSULE | Freq: Once | ORAL | Status: AC
  Administered 2015-06-19: 300 mg via ORAL
  Filled 2015-06-19: qty 2

## 2015-06-19 NOTE — ED Notes (Signed)
Patient complaining of dental abscess on lower right area.

## 2015-06-19 NOTE — Discharge Instructions (Signed)
It is extremely important that she see a dentist systems possible. Please take your medications as prescribed until all taken.

## 2015-06-19 NOTE — ED Provider Notes (Signed)
CSN: [id number]     Arrival date & time 06/19/15  1648 History   First MD Initiated Contact with Patient 06/19/15 1809     Chief Complaint  Patient presents with  . Dental Pain     (Consider location/radiation/quality/duration/timing/severity/associated sxs/prior Treatment) Patient is a 34 y.o. male presenting with tooth pain. The history is provided by the patient.  Dental Pain Location:  Lower Quality:  Throbbing Severity:  Moderate Onset quality:  Gradual Timing:  Intermittent Progression:  Worsening Chronicity:  Chronic Context: dental caries   Context: not trauma   Relieved by:  Nothing Worsened by:  Cold food/drink Ineffective treatments:  None tried Associated symptoms: gum swelling   Associated symptoms: no difficulty swallowing, no fever and no trismus   Risk factors: smoking   Risk factors: no alcohol problem and no immunosuppression     Past Medical History  Diagnosis Date  . Herniated disc   . Testicular mass     right  . 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[place]) 02/07/2014  . 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    Past Surgical History  Procedure Laterality Date  . Orchiectomy Right 02/07/2014    Procedure: RIGHT RADICAL ORCHIECTOMY;  Surgeon: Malka So, MD;  Location: [facility];  Service: Urology;  Laterality: Right;   Family History  Problem Relation Age of Onset  . Diabetes Other   . Heart failure Other    Social History  Substance Use Topics  . Smoking status: Current Every Day Smoker -- 0.50 packs/day for 13 years    Types: Cigarettes  . Smokeless tobacco: Never Used  . Alcohol Use: No    Review of Systems  Constitutional: Negative for fever.  HENT: Positive for dental problem.   Musculoskeletal: Positive for back pain.  All other systems reviewed and are negative.     Allergies  Tegaderm ag mesh  Home Medications   Prior to Admission medications   Medication Sig Start Date End  Date Taking? Authorizing Provider  ibuprofen (ADVIL,MOTRIN) 200 MG tablet Take 600 mg by mouth every 6 (six) hours as needed for moderate pain.   Yes Historical Provider, MD  lidocaine-prilocaine (EMLA) cream Apply 1 application topically as needed. Patient not taking: Reported on 03/29/2015 03/30/14   Wyatt Portela, MD  prochlorperazine (COMPAZINE) 10 MG tablet Take 1 tablet (10 mg total) by mouth every 6 (six) hours as needed for nausea or vomiting. Patient not taking: Reported on 03/29/2015 04/25/14   Wyatt Portela, MD   BP 121/86 mmHg  Pulse 77  Temp(Src) 98.1 F (36.7 C) (Oral)  Resp 20  Ht 5\' 8"  (1.727 m)  Wt 185 lb (83.915 kg)  BMI 28.14 kg/m2  SpO2 100% Physical Exam  Constitutional: He is oriented to person, place, and time. He appears well-developed and well-nourished.  Non-toxic appearance.  HENT:  Head: Normocephalic.  Right Ear: Tympanic membrane and external ear normal.  Left Ear: Tympanic membrane and external ear normal.  The premolar and molar of the right lower jaw is decayed to the gum line. There is gum disease and dental caries several other teeth of the lower jaw. The airway is patent. There is no swelling under the tongue. The speech is clear. There is no trismus noted.  Eyes: EOM and lids are normal. Pupils are equal, round, and reactive to light.  Neck: Normal range of motion. Neck supple. Carotid bruit is not present.  Cardiovascular: Normal rate,  regular rhythm, normal heart sounds, intact distal pulses and normal pulses.   Pulmonary/Chest: Breath sounds normal. No respiratory distress.  Abdominal: Soft. Bowel sounds are normal. There is no tenderness. There is no guarding.  Musculoskeletal: Normal range of motion.  Lymphadenopathy:       Head (right side): No submandibular adenopathy present.       Head (left side): No submandibular adenopathy present.    He has no cervical adenopathy.  Neurological: He is alert and oriented to person, place, and time. He  has normal strength. No cranial nerve deficit or sensory deficit.  Skin: Skin is warm and dry.  Psychiatric: He has a normal mood and affect. His speech is normal.  Nursing note and vitals reviewed.   ED Course  Procedures (including critical care time) Labs Review Labs Reviewed - No data to display  Imaging Review No results found. I have personally reviewed and evaluated these images and lab results as part of my medical decision-making.   EKG Interpretation None      MDM  Vital signs are well within normal limits. Discussed the need for dental assistance as soon as possible. No evidence for Ludwig's angina. no trismus noted. Description for clindamycin and ibuprofen given to the patient.    Final diagnoses:  None    **I have reviewed nursing notes, vital signs, and all appropriate lab and imaging results for this patient.Lily Kocher, PA-C 06/19/15 1859  Lacretia Leigh, MD 06/20/15 2318

## 2015-06-27 ENCOUNTER — Telehealth: Payer: Self-pay | Admitting: Oncology

## 2015-06-27 NOTE — Telephone Encounter (Signed)
Patient called to move 10/21 lab/flush to 10/24 - patient already moved ct. Patient has new date/time.

## 2015-06-29 ENCOUNTER — Other Ambulatory Visit: Payer: Self-pay

## 2015-06-29 ENCOUNTER — Ambulatory Visit (HOSPITAL_COMMUNITY): Payer: Self-pay

## 2015-07-02 ENCOUNTER — Other Ambulatory Visit: Payer: Self-pay

## 2015-07-02 ENCOUNTER — Ambulatory Visit (HOSPITAL_COMMUNITY): Payer: Self-pay

## 2015-07-02 ENCOUNTER — Other Ambulatory Visit: Payer: Self-pay | Admitting: *Deleted

## 2015-07-02 DIAGNOSIS — C6291 Malignant neoplasm of right testis, unspecified whether descended or undescended: Secondary | ICD-10-CM

## 2015-07-02 MED ORDER — HEPARIN SOD (PORK) LOCK FLUSH 100 UNIT/ML IV SOLN
500.0000 [IU] | Freq: Once | INTRAVENOUS | Status: DC
  Filled 2015-07-02: qty 5

## 2015-07-02 MED ORDER — SODIUM CHLORIDE 0.9 % IJ SOLN
10.0000 mL | Freq: Once | INTRAMUSCULAR | Status: DC
  Filled 2015-07-02: qty 10

## 2015-07-03 ENCOUNTER — Ambulatory Visit: Payer: Self-pay | Admitting: Oncology

## 2015-08-23 ENCOUNTER — Encounter: Payer: Self-pay | Admitting: Oncology

## 2015-08-24 ENCOUNTER — Encounter: Payer: Self-pay | Admitting: *Deleted

## 2015-08-24 NOTE — Progress Notes (Signed)
pof to schedulers to re-schedule missed lab,CT scan and dr visit on 10/24 and 10/25. Unable to reach patient with phone number listed.

## 2015-08-27 ENCOUNTER — Telehealth: Payer: Self-pay | Admitting: Oncology

## 2015-08-27 NOTE — Telephone Encounter (Signed)
Per 12/16 pof r/s miessed October appointments for 1st available. Called patient but was not able to reach him and number is not in service. Mailed scheduled along with radiology requisition form for ct.

## 2015-09-04 ENCOUNTER — Other Ambulatory Visit: Payer: Self-pay

## 2015-09-04 ENCOUNTER — Ambulatory Visit (HOSPITAL_COMMUNITY)
Admission: RE | Admit: 2015-09-04 | Discharge: 2015-09-04 | Disposition: A | Discharge status: Home / Self Care | Payer: Self-pay | Source: Ambulatory Visit | Attending: Oncology | Admitting: Oncology

## 2015-09-04 ENCOUNTER — Encounter (HOSPITAL_COMMUNITY): Payer: Self-pay

## 2015-09-04 ENCOUNTER — Other Ambulatory Visit (HOSPITAL_BASED_OUTPATIENT_CLINIC_OR_DEPARTMENT_OTHER): Payer: Self-pay

## 2015-09-04 DIAGNOSIS — C6291 Malignant neoplasm of right testis, unspecified whether descended or undescended: Secondary | ICD-10-CM | POA: Insufficient documentation

## 2015-09-04 DIAGNOSIS — Z9221 Personal history of antineoplastic chemotherapy: Secondary | ICD-10-CM | POA: Insufficient documentation

## 2015-09-04 DIAGNOSIS — Z9079 Acquired absence of other genital organ(s): Secondary | ICD-10-CM | POA: Insufficient documentation

## 2015-09-04 DIAGNOSIS — R590 Localized enlarged lymph nodes: Secondary | ICD-10-CM | POA: Insufficient documentation

## 2015-09-04 LAB — COMPREHENSIVE METABOLIC PANEL
ALBUMIN: 4.1 g/dL (ref 3.5–5.0)
ALK PHOS: 73 U/L (ref 40–150)
ALT: 18 U/L (ref 0–55)
ANION GAP: 10 meq/L (ref 3–11)
AST: 21 U/L (ref 5–34)
BUN: 10.9 mg/dL (ref 7.0–26.0)
CALCIUM: 9.3 mg/dL (ref 8.4–10.4)
CO2: 28 meq/L (ref 22–29)
CREATININE: 0.9 mg/dL (ref 0.7–1.3)
Chloride: 100 mEq/L (ref 98–109)
Glucose: 83 mg/dl (ref 70–140)
Potassium: 4.2 mEq/L (ref 3.5–5.1)
Sodium: 137 mEq/L (ref 136–145)
TOTAL PROTEIN: 8.3 g/dL (ref 6.4–8.3)

## 2015-09-04 LAB — CBC WITH DIFFERENTIAL/PLATELET
BASO%: 0.3 % (ref 0.0–2.0)
BASOS ABS: 0 10*3/uL (ref 0.0–0.1)
EOS ABS: 0.1 10*3/uL (ref 0.0–0.5)
EOS%: 1.3 % (ref 0.0–7.0)
HCT: 38.6 % (ref 38.4–49.9)
HEMOGLOBIN: 12.7 g/dL — AB (ref 13.0–17.1)
LYMPH%: 43 % (ref 14.0–49.0)
MCH: 27.4 pg (ref 27.2–33.4)
MCHC: 32.8 g/dL (ref 32.0–36.0)
MCV: 83.4 fL (ref 79.3–98.0)
MONO#: 0.5 10*3/uL (ref 0.1–0.9)
MONO%: 7.1 % (ref 0.0–14.0)
NEUT#: 3.1 10*3/uL (ref 1.5–6.5)
NEUT%: 48.3 % (ref 39.0–75.0)
PLATELETS: 156 10*3/uL (ref 140–400)
RBC: 4.63 10*6/uL (ref 4.20–5.82)
RDW: 13.9 % (ref 11.0–14.6)
WBC: 6.5 10*3/uL (ref 4.0–10.3)
lymph#: 2.8 10*3/uL (ref 0.9–3.3)

## 2015-09-04 LAB — LACTATE DEHYDROGENASE: LDH: 166 U/L (ref 125–245)

## 2015-09-04 MED ORDER — IOHEXOL 300 MG/ML  SOLN
100.0000 mL | Freq: Once | INTRAMUSCULAR | Status: AC | PRN
  Administered 2015-09-04: 100 mL via INTRAVENOUS

## 2015-09-06 ENCOUNTER — Ambulatory Visit (HOSPITAL_BASED_OUTPATIENT_CLINIC_OR_DEPARTMENT_OTHER): Payer: Self-pay | Admitting: Oncology

## 2015-09-06 ENCOUNTER — Telehealth: Payer: Self-pay | Admitting: Oncology

## 2015-09-06 VITALS — BP 123/74 | HR 71 | Temp 98.0°F | Resp 18 | Ht 68.0 in | Wt 180.9 lb

## 2015-09-06 DIAGNOSIS — C6291 Malignant neoplasm of right testis, unspecified whether descended or undescended: Secondary | ICD-10-CM

## 2015-09-06 NOTE — Addendum Note (Signed)
Addended by: Amelia Jo I on: 09/06/2015 02:03 PM   Modules accepted: Orders, Medications

## 2015-09-06 NOTE — Progress Notes (Signed)
Hematology and Oncology Follow Up Visit  Jeremy Clay [id number] 1980/10/12 34 y.o. 09/06/2015 1:56 PM   Principle Diagnosis: 34 year old gentleman with testicular cancer diagnosed in June of 2015. Jeremy Clay presented with a large testicular mass and found to have a stage IIB disease with periaortic lymphadenopathy. 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   Prior Therapy: Jeremy Clay is status post radical orchiectomy on 02/07/2014. The tumor measured 7.3 cm showed 10% embryonal, and 80% teratoma and 10% yolk sac tumor. Jeremy Clay is S/P BEP chemotherapy for 3 cycles completed in September 2015.  Current therapy: Observation and surveillance and Jeremy Clay is under evaluation for post chemotherapyRPLND.  Interim History:  Jeremy Clay presents today for a followup visit. Since his last visit, Jeremy Clay report no recent complaints. Jeremy Clay reports no residual complications related to chemotherapy and have felt well last year or so. Jeremy Clay has not reported any residual neuropathy, respiratory symptoms or any decline in his energy. Jeremy Clay used to work full-time without any decline. Jeremy Clay has not reported any abdominal pain or adenopathy.   Jeremy Clay did not report any fevers at this time.  Jeremy Clay does not report any chest pain or difficulty breathing. Did not report any cough or hemoptysis. Jeremy Clay is not reporting any chills or sweats or weight loss. Does not report any constipation or diarrhea or hematochezia. Is on current frequency urgency or hesitancy. Does not report any skeletal complaints. Is not reporting any lymphadenopathy or petechiae. Review of systems unremarkable.  Medications: I have reviewed the patient's current medications.  Current Outpatient Prescriptions  Medication Sig Dispense Refill  . clindamycin (CLEOCIN) 150 MG capsule Take 1 capsule (150 mg total) by mouth 4 (four) times daily. 28 capsule 0  . ibuprofen (ADVIL,MOTRIN) 800 MG tablet Take 1 tablet (800 mg total) by mouth 3  (three) times daily. 21 tablet 0  . lidocaine-prilocaine (EMLA) cream Apply 1 application topically as needed. (Patient not taking: Reported on 03/29/2015) 30 g 0  . prochlorperazine (COMPAZINE) 10 MG tablet Take 1 tablet (10 mg total) by mouth every 6 (six) hours as needed for nausea or vomiting. (Patient not taking: Reported on 03/29/2015) 30 tablet 0   No current facility-administered medications for this visit.     Allergies:  Allergies  Allergen Reactions  . Tegaderm Ag Mesh [Silver] Rash    Redness & irritation noted from IV site dressing 24 hrs after d/c'd     Past Medical History, Surgical history, Social history, and Family History were reviewed and updated.   Physical Exam: Blood pressure 123/74, pulse 71, temperature 98 F (36.7 C), temperature source Oral, resp. rate 18, height 5\' 8"  (1.727 m), weight 180 lb 14.4 oz (82.056 kg), SpO2 100 %.  ECOG: 0 General appearance: alert and cooperative  Well-appearing gentleman. Head: Normocephalic, without obvious abnormality no oral ulcers or lesions. Neck: no adenopathy Lymph nodes: Cervical, supraclavicular, and axillary nodes normal. Heart:regular rate and rhythm, S1, S2 normal, no murmur, click, rub or gallop Lung:chest clear, no wheezing, rales, normal symmetric air entry Abdomin: soft, non-tender, without masses or organomegaly no shifting dullness or ascites. EXT:no erythema, induration, or nodules   Lab Results: Lab Results  Component Value Date   WBC 6.5 09/04/2015   HGB 12.7* 09/04/2015   HCT 38.6 09/04/2015   MCV 83.4 09/04/2015   PLT 156 09/04/2015     Chemistry      Component Value Date/Time   NA 137 09/04/2015 1529   NA  136 04/18/2014 0916   K 4.2 09/04/2015 1529   K 4.1 04/18/2014 0916   CL 97 04/18/2014 0916   CO2 28 09/04/2015 1529   CO2 25 04/18/2014 0916   BUN 10.9 09/04/2015 1529   BUN 8 04/18/2014 0916   CREATININE 0.9 09/04/2015 1529   CREATININE 0.71 04/18/2014 0916      Component Value  Date/Time   CALCIUM 9.3 09/04/2015 1529   CALCIUM 9.6 04/18/2014 0916   ALKPHOS 73 09/04/2015 1529   ALKPHOS 81 04/18/2014 0916   AST 21 09/04/2015 1529   AST 14 04/18/2014 0916   ALT 18 09/04/2015 1529   ALT 26 04/18/2014 0916   BILITOT <0.30 09/04/2015 1529   BILITOT <0.2* 04/18/2014 0916     EXAM: CT CHEST, ABDOMEN, AND PELVIS WITH CONTRAST  TECHNIQUE: Multidetector CT imaging of the chest, abdomen and pelvis was performed following the standard protocol during bolus administration of intravenous contrast.  CONTRAST: 173mL OMNIPAQUE IOHEXOL 300 MG/ML SOLN  COMPARISON: AP CT on 11/28/2014 and chest CT on 07/25/2014  FINDINGS: CT CHEST FINDINGS  Mediastinum/Lymph Nodes: No masses, pathologically enlarged lymph nodes, or other significant abnormality.  Lungs/Pleura: No pulmonary mass, infiltrate, or effusion.  Musculoskeletal: No chest wall mass or suspicious bone lesions identified.  CT ABDOMEN PELVIS FINDINGS  Hepatobiliary: No masses or other significant abnormality. Gallbladder is unremarkable.  Pancreas: No mass, inflammatory changes, or other significant abnormality.  Spleen: Within normal limits in size and appearance.  Adrenals/Urinary Tract: No masses identified. No evidence of hydronephrosis.  Stomach/Bowel: No evidence of obstruction, inflammatory process, or abnormal fluid collections.  Vascular/Lymphatic: Retroperitoneal lymphadenopathy in the aortocaval space measures 2.1 x 3.2 cm on image 79/ series 3 compared to 2.1 x 3.3 cm previously. No other pathologically enlarged lymph nodes identified within the abdomen or pelvis.  Reproductive: Normal size prostate. Stable postop changes from previous right orchiectomy.  Other: None.  Musculoskeletal: No suspicious bone lesions identified.  IMPRESSION: Stable abdominal retroperitoneal lymphadenopathy in the aortocaval space.  No new or progressive disease identified  within the chest, abdomen, or pelvis.    Impression and Plan:  34 year old gentleman with the following issues:  1. Testicular cancer diagnosed in June of 2015. Jeremy Clay presented with a large right testicular mass and found to have a stage IIB disease. 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 Jeremy Clay is status post 3 cycles of BEP chemotherapy completed in 05/2014.   His CT scan from 09/04/2015 was reviewed today and showed no changes in his retroperitoneal adenopathy appears to be stable around 3 cm. The differential diagnosis was reviewed again including residual viable tumor although unlikely given his normal tumor markers. This is likely represents either scar tissue or teratoma that likely will require postchemotherapy RPLND. Jeremy Clay had a referral at [facility] but they could not see him because of insurance issues. We will refer him to [facility] for evaluation there.  2. IV access: Port-A-Cath remained in place but we will consider removal if his next imaging studies continue to be stable.  3. 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  4. Followup: In 6 months  We'll repeat imaging studies.     Zola Button, MD 12/29/20161:56 PM

## 2015-09-06 NOTE — Telephone Encounter (Signed)
Gave pt appts for Feb, April, June and July. Advised pt radiology will call to schedule ct scan in June. Contrast given.

## 2015-09-07 LAB — BETA HCG QUANT (REF LAB): Beta hCG, Tumor Marker: <2 m[IU]/mL (ref <5.0)

## 2015-09-07 LAB — AFP TUMOR MARKER: AFP-Tumor Marker: 3.1 ng/mL (ref <6.1)

## 2015-10-31 ENCOUNTER — Telehealth: Payer: Self-pay | Admitting: Oncology

## 2015-10-31 NOTE — Telephone Encounter (Signed)
Patient called to r/s 2/23 flush appointment to 3/2. Patient has new date/time.

## 2015-11-08 ENCOUNTER — Encounter: Payer: Self-pay | Admitting: *Deleted

## 2015-11-08 ENCOUNTER — Ambulatory Visit: Payer: Self-pay

## 2015-11-08 NOTE — Progress Notes (Signed)
Pt NS for this appt, called mobile number listed, no answer, did not leave message

## 2015-12-19 ENCOUNTER — Telehealth: Payer: Self-pay

## 2016-01-03 ENCOUNTER — Ambulatory Visit (HOSPITAL_BASED_OUTPATIENT_CLINIC_OR_DEPARTMENT_OTHER): Payer: Self-pay

## 2016-01-03 VITALS — BP 134/81 | HR 78 | Temp 98.4°F | Resp 16

## 2016-01-03 DIAGNOSIS — C6291 Malignant neoplasm of right testis, unspecified whether descended or undescended: Secondary | ICD-10-CM

## 2016-01-03 DIAGNOSIS — Z452 Encounter for adjustment and management of vascular access device: Secondary | ICD-10-CM

## 2016-01-03 MED ORDER — HEPARIN SOD (PORK) LOCK FLUSH 100 UNIT/ML IV SOLN
500.0000 [IU] | Freq: Once | INTRAVENOUS | Status: AC
  Administered 2016-01-03: 500 [IU] via INTRAVENOUS
  Filled 2016-01-03: qty 5

## 2016-01-03 MED ORDER — SODIUM CHLORIDE 0.9% FLUSH
10.0000 mL | INTRAVENOUS | Status: DC | PRN
  Administered 2016-01-03: 10 mL via INTRAVENOUS
  Filled 2016-01-03: qty 10

## 2016-01-06 ENCOUNTER — Emergency Department (HOSPITAL_COMMUNITY): Payer: Self-pay

## 2016-01-06 ENCOUNTER — Encounter (HOSPITAL_COMMUNITY): Payer: Self-pay | Admitting: Emergency Medicine

## 2016-01-06 ENCOUNTER — Emergency Department (HOSPITAL_COMMUNITY)
Admission: EM | Admit: 2016-01-06 | Discharge: 2016-01-06 | Disposition: A | Discharge status: Home / Self Care | Payer: Self-pay | Attending: Emergency Medicine | Admitting: Emergency Medicine

## 2016-01-06 DIAGNOSIS — S60211A Contusion of right wrist, initial encounter: Secondary | ICD-10-CM | POA: Insufficient documentation

## 2016-01-06 DIAGNOSIS — W228XXA Striking against or struck by other objects, initial encounter: Secondary | ICD-10-CM | POA: Insufficient documentation

## 2016-01-06 DIAGNOSIS — Y999 Unspecified external cause status: Secondary | ICD-10-CM | POA: Insufficient documentation

## 2016-01-06 DIAGNOSIS — Z8547 Personal history of malignant neoplasm of testis: Secondary | ICD-10-CM | POA: Insufficient documentation

## 2016-01-06 DIAGNOSIS — Y9389 Activity, other specified: Secondary | ICD-10-CM | POA: Insufficient documentation

## 2016-01-06 DIAGNOSIS — Y929 Unspecified place or not applicable: Secondary | ICD-10-CM | POA: Insufficient documentation

## 2016-01-06 DIAGNOSIS — F17210 Nicotine dependence, cigarettes, uncomplicated: Secondary | ICD-10-CM | POA: Insufficient documentation

## 2016-01-06 NOTE — ED Notes (Signed)
Patient c/o right wrist pain. 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 Patient has abrasion to right wrist. Per patient increased pain with certain movements.

## 2016-01-06 NOTE — ED Notes (Signed)
Ice given to pt. 

## 2016-01-06 NOTE — ED Notes (Signed)
Pt made aware to return if symptoms worsen or if any life threatening symptoms occur.   

## 2016-01-06 NOTE — ED Provider Notes (Signed)
CSN: [id number]     Arrival date & time 01/06/16  1424 History  By signing my name below, I, Randa Evens, attest that this documentation has been prepared under the direction and in the presence of Lily Kocher, PA-C. Electronically Signed: Randa Evens, ED Scribe. 01/06/2016. 3:31 PM.    Chief Complaint  Patient presents with  . Wrist Pain    Patient is a 35 y.o. male presenting with wrist pain. The history is provided by the patient. No language interpreter was used.  Wrist Pain This is a new problem. The current episode started 12 to 24 hours ago. The problem occurs rarely. The problem has not changed since onset.Nothing relieves the symptoms. He has tried nothing for the symptoms.   HPI Comments: Jeremy Clay is a 35 y.o. male who presents to the Emergency Department complaining of right wrist injury onset 1 day prior. 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 Pt states that he has associated swelling and abrasion on the right wrsit. Denies anticoagulant use. Pt doesn't report any medications or treatments tried PTA. Pt denies numbness or tingling.   Past Medical History  Diagnosis Date  . Herniated disc   . Testicular mass     right  . 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[place]) 02/07/2014  . 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    Past Surgical History  Procedure Laterality Date  . Orchiectomy Right 02/07/2014    Procedure: RIGHT RADICAL ORCHIECTOMY;  Surgeon: Malka So, MD;  Location: [facility];  Service: Urology;  Laterality: Right;   Family History  Problem Relation Age of Onset  . Diabetes Other   . Heart failure Other    Social History  Substance Use Topics  . Smoking status: Current Every Day Smoker -- 0.50 packs/day for 13 years    Types: Cigarettes  . Smokeless tobacco: Never Used  . Alcohol Use: No    Review of Systems  Musculoskeletal:  Positive for joint swelling and arthralgias.  Neurological: Negative for numbness.  All other systems reviewed and are negative.     Allergies  Tegaderm ag mesh  Home Medications   Prior to Admission medications   Medication Sig Start Date End Date Taking? Authorizing Provider  ibuprofen (ADVIL,MOTRIN) 800 MG tablet Take 1 tablet (800 mg total) by mouth 3 (three) times daily. Patient not taking: Reported on 09/06/2015 06/19/15   Lily Kocher, PA-C  prochlorperazine (COMPAZINE) 10 MG tablet Take 1 tablet (10 mg total) by mouth every 6 (six) hours as needed for nausea or vomiting. Patient not taking: Reported on 03/29/2015 04/25/14   Wyatt Portela, MD   BP 140/92 mmHg  Pulse 72  Temp(Src) 98.1 F (36.7 C) (Oral)  Resp 16  Ht 5\' 10"  (1.778 m)  Wt 185 lb (83.915 kg)  BMI 26.54 kg/m2  SpO2 100%    Physical Exam  Constitutional: He is oriented to person, place, and time. He appears well-developed and well-nourished. No distress.  HENT:  Head: Normocephalic and atraumatic.  Eyes: Conjunctivae and EOM are normal.  Neck: Neck supple. No tracheal deviation present.  Cardiovascular: Normal rate.   Pulmonary/Chest: Effort normal. No respiratory distress.  Musculoskeletal: Normal range of motion.  full ROM of right shoulder and elbow, good flexion and extension of the right wrist, abrasion to dorsal area of right wrist, bruising of dorsum of  right hand, cap refill less than 2 seconds,  radial pulse 2+, no deformity appreciated.   Neurological: He is alert and oriented to person, place, and time.  Skin: Skin is warm and dry.  Psychiatric: He has a normal mood and affect. His behavior is normal.  Nursing note and vitals reviewed.   ED Course  Procedures (including critical care time) DIAGNOSTIC STUDIES: Oxygen Saturation is 100% on RA, normal by my interpretation.    COORDINATION OF CARE: 3:28 PM-Discussed treatment plan with pt at bedside and pt agreed to plan.     Labs  Review Labs Reviewed - No data to display  Imaging Review Dg Wrist Complete Right  01/06/2016  CLINICAL DATA:  Acute right wrist pain after hitting fence yesterday. Initial encounter. EXAM: RIGHT WRIST - COMPLETE 3+ VIEW COMPARISON:  None. FINDINGS: There is no evidence of fracture or dislocation. There is no evidence of arthropathy or other focal bone abnormality. Soft tissues are unremarkable. IMPRESSION: Normal right wrist. Electronically Signed   By: Marijo Conception, M.D.   On: 01/06/2016 15:16   I have personally reviewed and evaluated these images as part of my medical decision-making.   EKG Interpretation None      MDM  Vital signs reviewed. The xray of the right wrist is negative for fx or dislocation. Discussed findings with the patient. Pt will use ace wrap and ice pack to the wrist and hand area. Pt will follow up with orthopedics if not improving.   Final diagnoses:  Contusion, wrist, right, initial encounter     **I personally performed the services described in this documentation, which was scribed in my presence. The recorded information has been reviewed and is accurate. I have reviewed nursing notes, vital signs, and all appropriate lab and imaging results for this patient.Lily Kocher, PA-C 01/06/16 [place], MD 01/06/16 1536

## 2016-01-06 NOTE — Discharge Instructions (Signed)
Your xray is negative for fracture or dislocation.  Please use ace bandage for support . Apply ice today. See Dr Aline Brochure of additional evaluation if not improving. Contusion A contusion is a deep bruise. Contusions happen when an injury causes bleeding under the skin. Symptoms of bruising include pain, swelling, and discolored skin. The skin may turn blue, purple, or yellow. HOME CARE   Rest the injured area.  If told, put ice on the injured area.  Put ice in a plastic bag.  Place a towel between your skin and the bag.  Leave the ice on for 20 minutes, 2-3 times per day.  If told, put light pressure (compression) on the injured area using an elastic bandage. Make sure the bandage is not too tight. Remove it and put it back on as told by your doctor.  If possible, raise (elevate) the injured area above the level of your heart while you are sitting or lying down.  Take over-the-counter and prescription medicines only as told by your doctor. GET HELP IF:  Your symptoms do not get better after several days of treatment.  Your symptoms get worse.  You have trouble moving the injured area. GET HELP RIGHT AWAY IF:   You have very bad pain.  You have a loss of feeling (numbness) in a hand or foot.  Your hand or foot turns pale or cold.   This information is not intended to replace advice given to you by your health care provider. Make sure you discuss any questions you have with your health care provider.   Document Released: 02/11/2008 Document Revised: 05/16/2015 Document Reviewed: 01/10/2015 Elsevier Interactive Patient Education [employer].

## 2016-02-13 ENCOUNTER — Telehealth: Payer: Self-pay | Admitting: Oncology

## 2016-02-13 NOTE — Telephone Encounter (Signed)
Tried to contact pt 3 times with no response mailed pt letter to call Radiology Centralized Scheduling.

## 2016-03-05 ENCOUNTER — Other Ambulatory Visit: Payer: Self-pay | Admitting: *Deleted

## 2016-03-05 DIAGNOSIS — C6291 Malignant neoplasm of right testis, unspecified whether descended or undescended: Secondary | ICD-10-CM

## 2016-03-06 ENCOUNTER — Ambulatory Visit (HOSPITAL_COMMUNITY)
Admission: RE | Admit: 2016-03-06 | Discharge: 2016-03-06 | Disposition: A | Discharge status: Home / Self Care | Payer: Self-pay | Source: Ambulatory Visit | Attending: Oncology | Admitting: Oncology

## 2016-03-06 ENCOUNTER — Other Ambulatory Visit (HOSPITAL_BASED_OUTPATIENT_CLINIC_OR_DEPARTMENT_OTHER): Payer: Self-pay

## 2016-03-06 ENCOUNTER — Ambulatory Visit (HOSPITAL_BASED_OUTPATIENT_CLINIC_OR_DEPARTMENT_OTHER): Payer: Self-pay

## 2016-03-06 VITALS — BP 106/68 | HR 76 | Temp 98.2°F | Resp 16

## 2016-03-06 DIAGNOSIS — C6291 Malignant neoplasm of right testis, unspecified whether descended or undescended: Secondary | ICD-10-CM

## 2016-03-06 DIAGNOSIS — R590 Localized enlarged lymph nodes: Secondary | ICD-10-CM | POA: Insufficient documentation

## 2016-03-06 LAB — COMPREHENSIVE METABOLIC PANEL
ALK PHOS: 77 U/L (ref 40–150)
ALT: 29 U/L (ref 0–55)
AST: 23 U/L (ref 5–34)
Albumin: 3.8 g/dL (ref 3.5–5.0)
Anion Gap: 9 mEq/L (ref 3–11)
BUN: 12.3 mg/dL (ref 7.0–26.0)
CALCIUM: 9.3 mg/dL (ref 8.4–10.4)
CHLORIDE: 105 meq/L (ref 98–109)
CO2: 24 meq/L (ref 22–29)
CREATININE: 0.9 mg/dL (ref 0.7–1.3)
EGFR: >90 mL/min/{1.73_m2} (ref >90)
Glucose: 101 mg/dl (ref 70–140)
POTASSIUM: 3.9 meq/L (ref 3.5–5.1)
SODIUM: 138 meq/L (ref 136–145)
Total Bilirubin: <0.3 mg/dL (ref 0.20–1.20)
Total Protein: 7.9 g/dL (ref 6.4–8.3)

## 2016-03-06 LAB — CBC WITH DIFFERENTIAL/PLATELET
BASO%: 0.3 % (ref 0.0–2.0)
BASOS ABS: 0 10*3/uL (ref 0.0–0.1)
EOS%: 1.5 % (ref 0.0–7.0)
Eosinophils Absolute: 0.1 10*3/uL (ref 0.0–0.5)
HEMATOCRIT: 33.2 % — AB (ref 38.4–49.9)
HGB: 11.1 g/dL — ABNORMAL LOW (ref 13.0–17.1)
LYMPH#: 2.4 10*3/uL (ref 0.9–3.3)
LYMPH%: 34.1 % (ref 14.0–49.0)
MCH: 28.2 pg (ref 27.2–33.4)
MCHC: 33.4 g/dL (ref 32.0–36.0)
MCV: 84.3 fL (ref 79.3–98.0)
MONO#: 0.5 10*3/uL (ref 0.1–0.9)
MONO%: 7.3 % (ref 0.0–14.0)
NEUT#: 4.1 10*3/uL (ref 1.5–6.5)
NEUT%: 56.8 % (ref 39.0–75.0)
Platelets: 140 10*3/uL (ref 140–400)
RBC: 3.94 10*6/uL — AB (ref 4.20–5.82)
RDW: 13.2 % (ref 11.0–14.6)
WBC: 7.2 10*3/uL (ref 4.0–10.3)

## 2016-03-06 MED ORDER — IOPAMIDOL (ISOVUE-300) INJECTION 61%
100.0000 mL | Freq: Once | INTRAVENOUS | Status: AC | PRN
  Administered 2016-03-06: 100 mL via INTRAVENOUS

## 2016-03-06 MED ORDER — HEPARIN SOD (PORK) LOCK FLUSH 100 UNIT/ML IV SOLN
500.0000 [IU] | Freq: Once | INTRAVENOUS | Status: AC
  Administered 2016-03-06: 500 [IU]
  Filled 2016-03-06: qty 5

## 2016-03-06 MED ORDER — SODIUM CHLORIDE 0.9 % IJ SOLN
10.0000 mL | Freq: Once | INTRAMUSCULAR | Status: AC
Start: 2016-03-06 — End: 2016-03-06
  Administered 2016-03-06: 10 mL
  Filled 2016-03-06: qty 10

## 2016-03-06 MED ORDER — HEPARIN SOD (PORK) LOCK FLUSH 100 UNIT/ML IV SOLN
INTRAVENOUS | Status: AC
  Filled 2016-03-06: qty 5

## 2016-03-06 NOTE — Progress Notes (Signed)
Attempted to access right chest port a cath, for radiology testing, after using sterile technique # 20 gauge inserted easily flushed with saline however no blood return noted. States has appointment this afternoon with [facility] and will have them check and try to obtain blood. Peripheral IV inserted for his testing. Tolerated well.

## 2016-03-07 ENCOUNTER — Telehealth: Payer: Self-pay | Admitting: *Deleted

## 2016-03-07 ENCOUNTER — Other Ambulatory Visit: Payer: Self-pay | Admitting: Oncology

## 2016-03-07 DIAGNOSIS — C6291 Malignant neoplasm of right testis, unspecified whether descended or undescended: Secondary | ICD-10-CM

## 2016-03-07 LAB — AFP TUMOR MARKER: AFP, Serum, Tumor Marker: 2.7 ng/mL (ref 0.0–8.3)

## 2016-03-07 LAB — LACTATE DEHYDROGENASE: LDH: 196 U/L (ref 125–245)

## 2016-03-07 LAB — BETA HCG QUANT (REF LAB): hCG Quant: <1 m[IU]/mL (ref 0–3)

## 2016-03-07 NOTE — Telephone Encounter (Signed)
-----   Message from Wyatt Portela, MD sent at 03/07/2016 10:24 AM EDT ----- Regarding: FW: Port issue Could you please he gets this message. CT is good and port will be removed. ----- Message -----    From: Wyatt Portela, MD    Sent: 03/07/2016  10:22 AM      To: Jarvis Morgan, RN Subject: RE: Port issue                                 Please let him know his scan is good and will remove the port. Orders to IR sent. Thanks. ----- Message -----    From: Jarvis Morgan, RN    Sent: 03/06/2016   4:51 PM      To: Randolm Idol, RN, Hebert Soho, RN, # Subject: Port issue                                     Dr. Alen Blew, [place] came for lab draw from portacath on 03/06/16.   Port accessed easily; flushed per protocol easily, but no blood return - despite different position attempts.  Pt stated he has had problems with port not having blood return before.   He also had CT scan done early am on same day in [place] - again, with no blood return from port.  Pt had to be stuck peripherally for scan and lab draws today. Pt mentioned about having port removal from his last office visit with you.  Pt has next appt with you on 04/01/16.  Please advise pt of your decisions. Thanks, Elray Buba, RN.

## 2016-03-07 NOTE — Telephone Encounter (Signed)
As noted below by Dr. Alen Blew, I left a voice message for patient informing him of CT results. Also, it's OK to have port-a-cath removed. Instructed him to call [phone] if he had any questions or concerns.

## 2016-03-09 ENCOUNTER — Telehealth: Payer: Self-pay | Admitting: Oncology

## 2016-03-09 NOTE — Telephone Encounter (Signed)
PAL - moved 7/7 f/u to 7/25. Left  Message for patient confirming change and mailed schedule.

## 2016-03-11 LAB — ALPHA FETO PROTEIN (PARALLEL TESTING): AFP TUMOR MARKER: 2.6 ng/mL (ref <6.1)

## 2016-03-11 LAB — BETA HCG, QN (PARALLEL TESTING): Beta hCG, Tumor Marker: <2 m[IU]/mL (ref <5.0)

## 2016-03-12 ENCOUNTER — Other Ambulatory Visit: Payer: Self-pay | Admitting: General Surgery

## 2016-03-13 ENCOUNTER — Other Ambulatory Visit: Payer: Self-pay | Admitting: Radiology

## 2016-03-13 ENCOUNTER — Other Ambulatory Visit: Payer: Self-pay | Admitting: General Surgery

## 2016-03-14 ENCOUNTER — Ambulatory Visit (HOSPITAL_COMMUNITY)
Admission: RE | Admit: 2016-03-14 | Discharge: 2016-03-14 | Disposition: A | Discharge status: Home / Self Care | Payer: Self-pay | Source: Ambulatory Visit | Attending: Oncology | Admitting: Oncology

## 2016-03-14 ENCOUNTER — Encounter (HOSPITAL_COMMUNITY): Payer: Self-pay

## 2016-03-14 ENCOUNTER — Ambulatory Visit: Payer: Self-pay | Admitting: Oncology

## 2016-03-14 DIAGNOSIS — Z452 Encounter for adjustment and management of vascular access device: Secondary | ICD-10-CM | POA: Insufficient documentation

## 2016-03-14 DIAGNOSIS — Z9221 Personal history of antineoplastic chemotherapy: Secondary | ICD-10-CM | POA: Insufficient documentation

## 2016-03-14 DIAGNOSIS — Z8547 Personal history of malignant neoplasm of testis: Secondary | ICD-10-CM | POA: Insufficient documentation

## 2016-03-14 DIAGNOSIS — C6291 Malignant neoplasm of right testis, unspecified whether descended or undescended: Secondary | ICD-10-CM

## 2016-03-14 DIAGNOSIS — Z9079 Acquired absence of other genital organ(s): Secondary | ICD-10-CM | POA: Insufficient documentation

## 2016-03-14 LAB — CBC
HEMATOCRIT: 37.4 % — AB (ref 39.0–52.0)
Hemoglobin: 12.2 g/dL — ABNORMAL LOW (ref 13.0–17.0)
MCH: 27.9 pg (ref 26.0–34.0)
MCHC: 32.6 g/dL (ref 30.0–36.0)
MCV: 85.6 fL (ref 78.0–100.0)
Platelets: 161 10*3/uL (ref 150–400)
RBC: 4.37 MIL/uL (ref 4.22–5.81)
RDW: 13.4 % (ref 11.5–15.5)
WBC: 5.9 10*3/uL (ref 4.0–10.5)

## 2016-03-14 LAB — PROTIME-INR
INR: 0.89 (ref 0.00–1.49)
Prothrombin Time: 12.2 seconds (ref 11.6–15.2)

## 2016-03-14 LAB — APTT: aPTT: 26 seconds (ref 24–37)

## 2016-03-14 MED ORDER — FENTANYL CITRATE (PF) 100 MCG/2ML IJ SOLN
INTRAMUSCULAR | Status: AC
  Filled 2016-03-14: qty 4

## 2016-03-14 MED ORDER — CEFAZOLIN SODIUM-DEXTROSE 2-4 GM/100ML-% IV SOLN
2.0000 g | INTRAVENOUS | Status: AC
  Administered 2016-03-14: 2 g via INTRAVENOUS
  Filled 2016-03-14: qty 100

## 2016-03-14 MED ORDER — MIDAZOLAM HCL 2 MG/2ML IJ SOLN
INTRAMUSCULAR | Status: AC | PRN
  Administered 2016-03-14 (×4): 1 mg via INTRAVENOUS

## 2016-03-14 MED ORDER — SODIUM CHLORIDE 0.9 % IV SOLN: INTRAVENOUS | Status: DC

## 2016-03-14 MED ORDER — MIDAZOLAM HCL 2 MG/2ML IJ SOLN
INTRAMUSCULAR | Status: AC
  Filled 2016-03-14: qty 6

## 2016-03-14 MED ORDER — LIDOCAINE HCL 1 % IJ SOLN
INTRAMUSCULAR | Status: AC
  Filled 2016-03-14: qty 20

## 2016-03-14 MED ORDER — FENTANYL CITRATE (PF) 100 MCG/2ML IJ SOLN
INTRAMUSCULAR | Status: AC | PRN
  Administered 2016-03-14: 50 ug via INTRAVENOUS
  Administered 2016-03-14 (×2): 25 ug via INTRAVENOUS

## 2016-03-14 NOTE — Procedures (Signed)
Interventional Radiology Procedure Note  Procedure: Removal of a right IJ approach single lumen PowerPort.  Removed in its entirety.  Complications: None Recommendations:  - Ok to shower tomorrow - Do not submerge for 7 days - Routine wound care   Signed,  Muriah Harsha S. Chealsey Miyamoto, DO    

## 2016-03-14 NOTE — Discharge Instructions (Signed)
Incision Care °An incision is when a surgeon cuts into your body. After surgery, the incision needs to be cared for properly to prevent infection.  °HOW TO CARE FOR YOUR INCISION °· Take medicines only as directed by your health care provider. °· There are many different ways to close and cover an incision, including stitches, skin glue, and adhesive strips. Follow your health care provider's instructions on: °¨ Incision care. °¨ Bandage (dressing) changes and removal. °¨ Incision closure removal. °· Do not take baths, swim, or use a hot tub until your health care provider approves. You may shower as directed by your health care provider. °· Resume your normal diet and activities as directed. °· Use anti-itch medicine (such as an antihistamine) as directed by your health care provider. The incision may itch while it is healing. Do not pick or scratch at the incision. °· Drink enough fluid to keep your urine clear or pale yellow. °SEEK MEDICAL CARE IF:  °· You have drainage, redness, swelling, or pain at your incision site. °· You have muscle aches, chills, or a general ill feeling. °· You notice a bad smell coming from the incision or dressing. °· Your incision edges separate after the sutures, staples, or skin adhesive strips have been removed. °· You have persistent nausea or vomiting. °· You have a fever. °· You are dizzy. °SEEK IMMEDIATE MEDICAL CARE IF:  °· You have a rash. °· You faint. °· You have difficulty breathing. °MAKE SURE YOU:  °· Understand these instructions. °· Will watch your condition. °· Will get help right away if you are not doing well or get worse. °  °This information is not intended to replace advice given to you by your health care provider. Make sure you discuss any questions you have with your health care provider. °  °Document Released: 03/14/2005 Document Revised: 09/15/2014 Document Reviewed: 10/19/2013 °Elsevier Interactive Patient Education ©2016 Elsevier Inc. ° ° °Moderate  Conscious Sedation, Adult, Care After °Refer to this sheet in the next few weeks. These instructions provide you with information on caring for yourself after your procedure. Your health care provider may also give you more specific instructions. Your treatment has been planned according to current medical practices, but problems sometimes occur. Call your health care provider if you have any problems or questions after your procedure. °WHAT TO EXPECT AFTER THE PROCEDURE  °After your procedure: °· You may feel sleepy, clumsy, and have poor balance for several hours. °· Vomiting may occur if you eat too soon after the procedure. °HOME CARE INSTRUCTIONS °· Do not participate in any activities where you could become injured for at least 24 hours. Do not: °¨ Drive. °¨ Swim. °¨ Ride a bicycle. °¨ Operate heavy machinery. °¨ Cook. °¨ Use power tools. °¨ Climb ladders. °¨ Work from a high place. °· Do not make important decisions or sign legal documents until you are improved. °· If you vomit, drink water, juice, or soup when you can drink without vomiting. Make sure you have little or no nausea before eating solid foods. °· Only take over-the-counter or prescription medicines for pain, discomfort, or fever as directed by your health care provider. °· Make sure you and your family fully understand everything about the medicines given to you, including what side effects may occur. °· You should not drink alcohol, take sleeping pills, or take medicines that cause drowsiness for at least 24 hours. °· If you smoke, do not smoke without supervision. °· If you are feeling   better, you may resume normal activities 24 hours after you were sedated. °· Keep all appointments with your health care provider. °SEEK MEDICAL CARE IF: °· Your skin is pale or bluish in color. °· You continue to feel nauseous or vomit. °· Your pain is getting worse and is not helped by medicine. °· You have bleeding or swelling. °· You are still sleepy or  feeling clumsy after 24 hours. °SEEK IMMEDIATE MEDICAL CARE IF: °· You develop a rash. °· You have difficulty breathing. °· You develop any type of allergic problem. °· You have a fever. °MAKE SURE YOU: °· Understand these instructions. °· Will watch your condition. °· Will get help right away if you are not doing well or get worse. °  °This information is not intended to replace advice given to you by your health care provider. Make sure you discuss any questions you have with your health care provider. °  °Document Released: 06/15/2013 Document Revised: 09/15/2014 Document Reviewed: 06/15/2013 °Elsevier Interactive Patient Education ©2016 Elsevier Inc. ° °

## 2016-03-14 NOTE — Progress Notes (Signed)
Patient ID: Jeremy Clay, male   DOB: 02/26/1981, 35 y.o.   MRN: [id number]    Referring Physician(s): Wyatt Portela  Supervising Physician: Corrie Mckusick  Patient Status:  Outpatient  Chief Complaint:  "I'm getting my port out"  Subjective:  Pt familiar to IR service from prior right chest wall port a cath placement on 03/30/14. He has a history of testicular cancer diagnosed in June 2015 with subsequent orchiectomy and chemotherapy. He has completed treatment and presents today for Port-A-Cath removal. He is currently asymptomatic.  Allergies: Tegaderm ag mesh  Medications: Prior to Admission medications   Medication Sig Start Date End Date Taking? Authorizing Provider  ibuprofen (ADVIL,MOTRIN) 800 MG tablet Take 1 tablet (800 mg total) by mouth 3 (three) times daily. 06/19/15  Yes Lily Kocher, PA-C  methadone (DOLOPHINE) 10 MG/ML solution Take 75 mg by mouth daily.   Yes Historical Provider, MD  prochlorperazine (COMPAZINE) 10 MG tablet Take 1 tablet (10 mg total) by mouth every 6 (six) hours as needed for nausea or vomiting. Patient not taking: Reported on 03/29/2015 04/25/14   Wyatt Portela, MD     Vital Signs: BP 125/74 mmHg  Pulse 80  Temp(Src) 98 F (36.7 C) (Oral)  Resp 18  SpO2 99%  Physical Exam patient awake, alert. Chest clear to auscultation bilaterally. Clean, intact, nontender right chest wall Port-A-Cath; heart with regular rate and rhythm. Abdomen soft, positive bowel sounds, nontender. Lower extremities with no edema  Imaging: No results found.  Labs:  CBC:  Recent Labs  03/29/15 1424 09/04/15 1528 03/06/16 1610 03/14/16 0730  WBC 7.0 6.5 7.2 5.9  HGB 12.1* 12.7* 11.1* 12.2*  HCT 36.5* 38.6 33.2* 37.4*  PLT 157 156 140 161    COAGS: No results for input(s): INR, APTT in the last 8760 hours.  BMP:  Recent Labs  03/29/15 1424 09/04/15 1529 03/06/16 1611  NA 140 137 138  K 3.9 4.2 3.9  CO2 26 28 24   GLUCOSE 132 83 101  BUN  18.6 10.9 12.3  CALCIUM 9.3 9.3 9.3  CREATININE 0.9 0.9 0.9    LIVER FUNCTION TESTS:  Recent Labs  03/29/15 1424 09/04/15 1529 03/06/16 1611  BILITOT 0.32 <0.30 <0.30  AST 19 21 23   ALT 20 18 29   ALKPHOS 70 73 77  PROT 7.9 8.3 7.9  ALBUMIN 4.1 4.1 3.8    Assessment and Plan: Patient with history of testicular cancer diagnosed in June 2015, right chest wall Port-A-Cath placement in July 2015; status post orchiectomy and chemotherapy. He has completed treatment and  presents today for Port-A-Cath removal. Details/risks of procedure, including but not limited to, internal bleeding, infection discussed with patient with his understanding and consent.   Electronically Signed: D. Rowe Robert 03/14/2016, 8:14 AM   I spent a total of 15 minutes at the the patient's bedside AND on the patient's hospital floor or unit, greater than 50% of which was counseling/coordinating care for port a cath removal

## 2016-04-01 ENCOUNTER — Ambulatory Visit: Payer: Self-pay | Admitting: Oncology

## 2016-04-23 IMAGING — US US SCROTUM
1 series · 13 of 25 positions shown · non-contrast
Comparison: None.

CLINICAL DATA: RIGHT testicular enlargement for 9 months,
nonpainful, hard smooth feel on palpation, groin swelling

EXAM:
SCROTAL ULTRASOUND
DOPPLER ULTRASOUND OF THE TESTICLES
TECHNIQUE: Complete ultrasound examination of the testicles, epididymis, and
other scrotal structures was performed. Color and spectral Doppler
ultrasound were also utilized to evaluate blood flow to the
testicles.

[Series 1: us scrotum · 0.05mm/px · 13 of 64 slices shown]
[im 1/64]
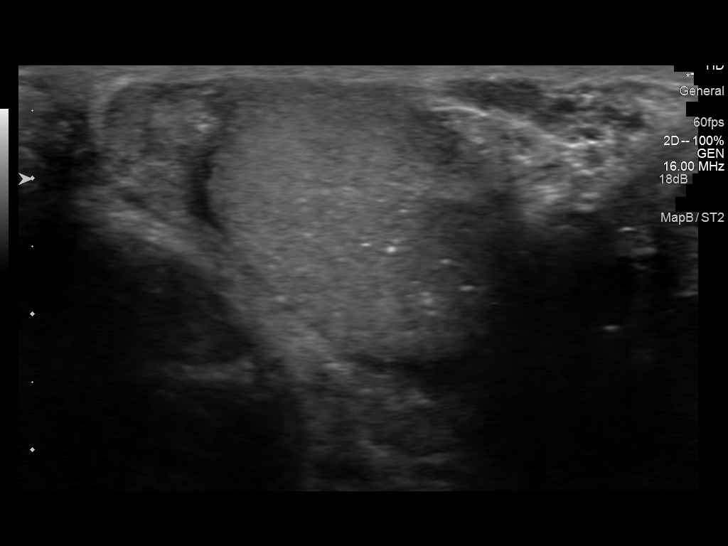
[im 6/64]
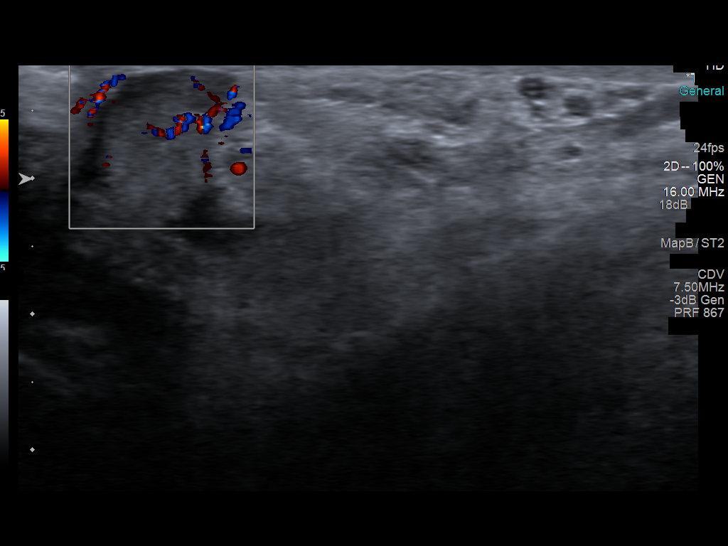
[im 11/64]
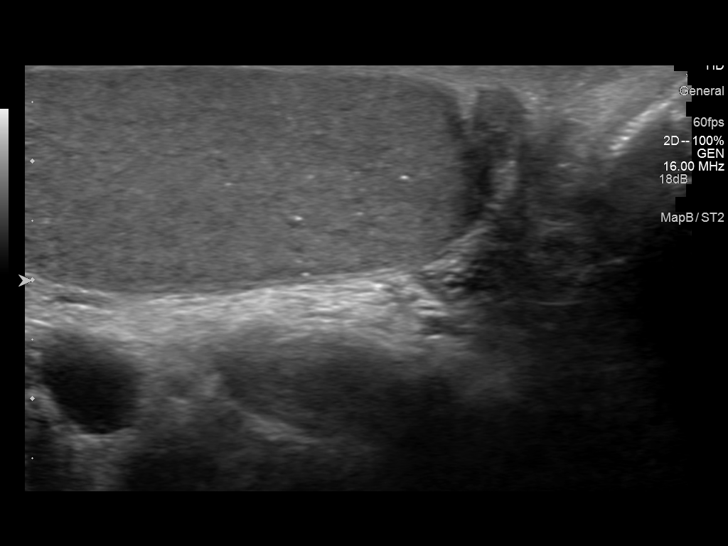
[im 16/64]
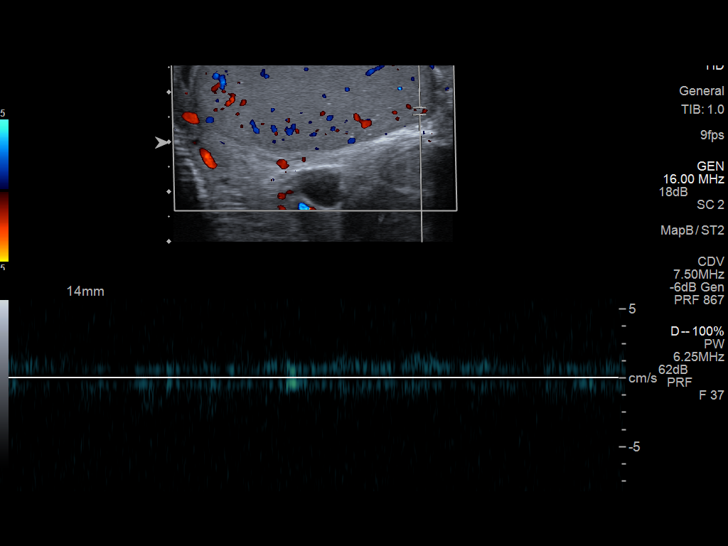
[im 22/64]
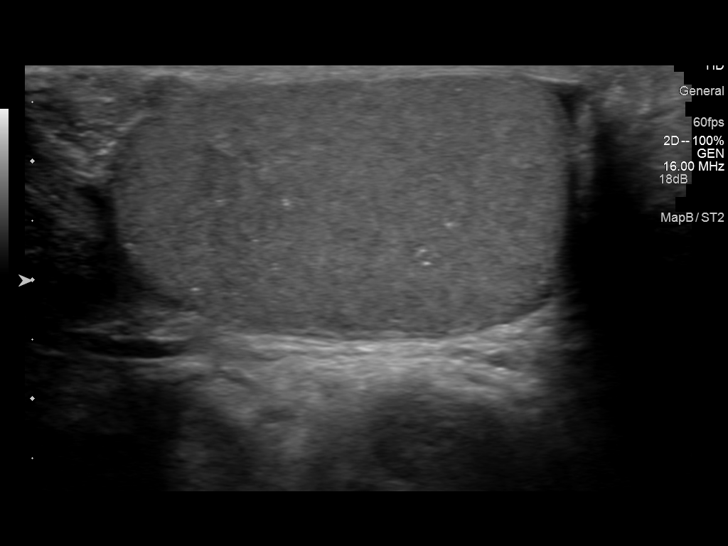
[im 27/64]
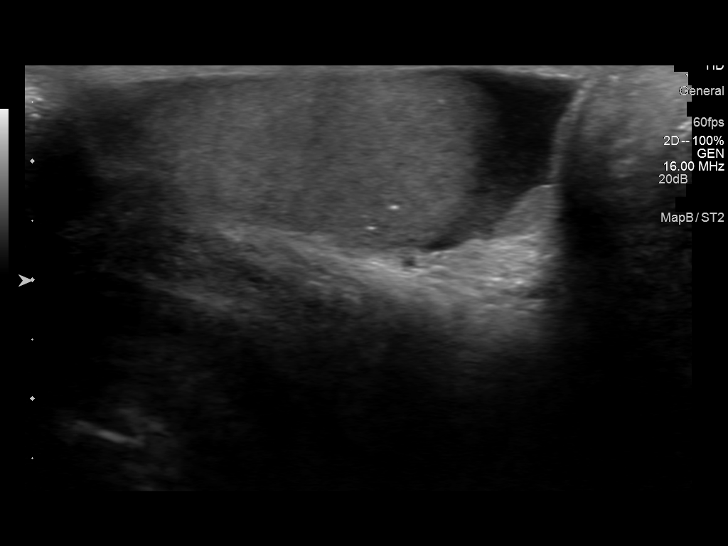
[im 32/64]
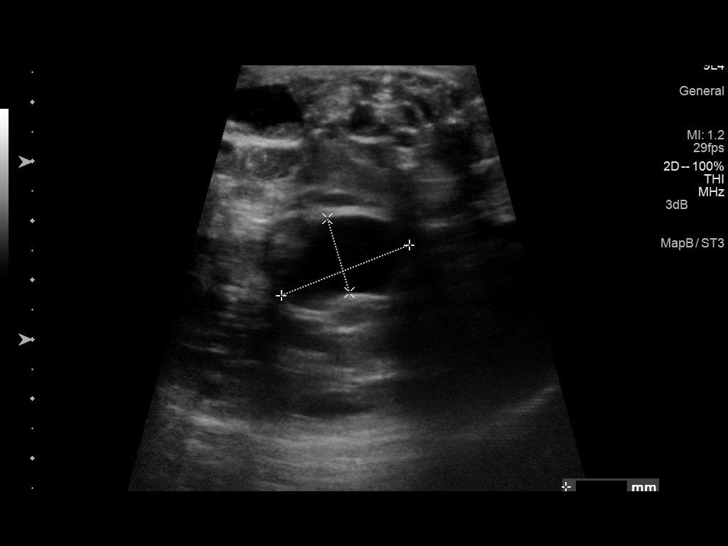
[im 37/64]
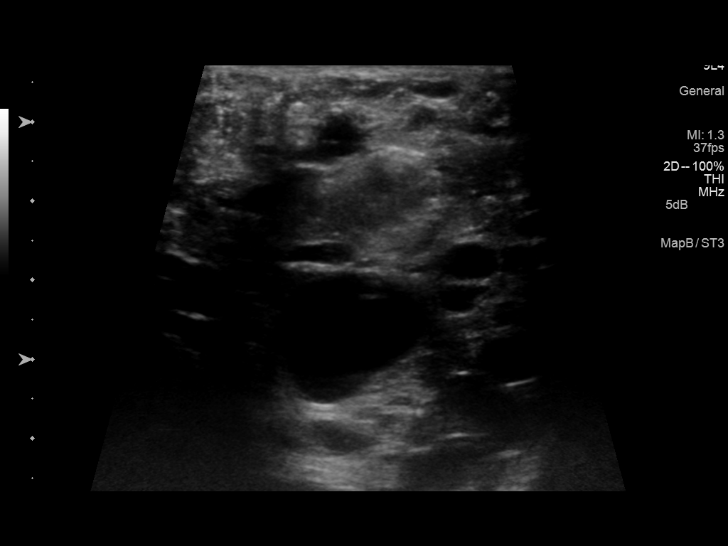
[im 43/64]
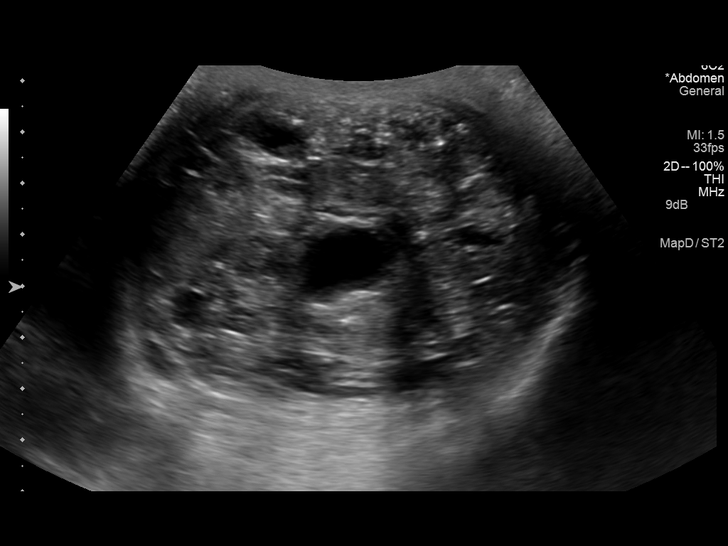
[im 48/64]
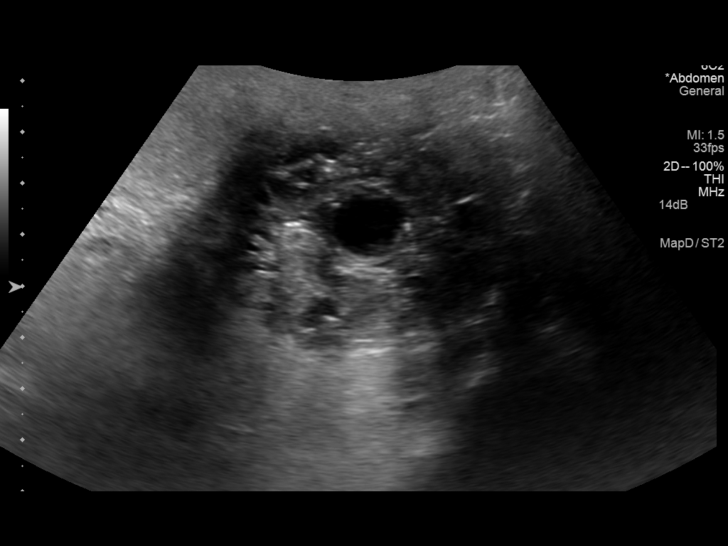
[im 53/64]
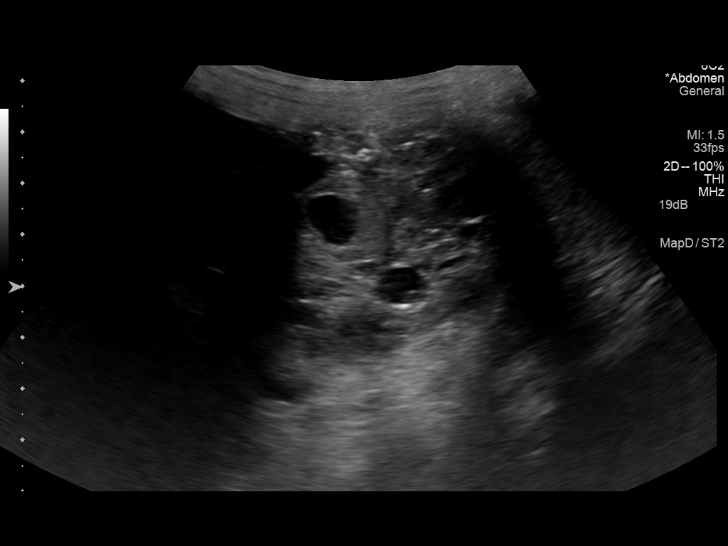
[im 58/64]
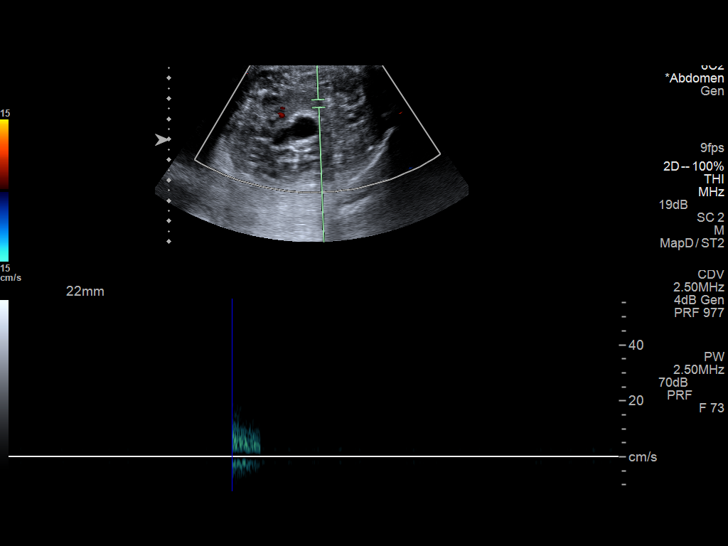
[im 64/64]
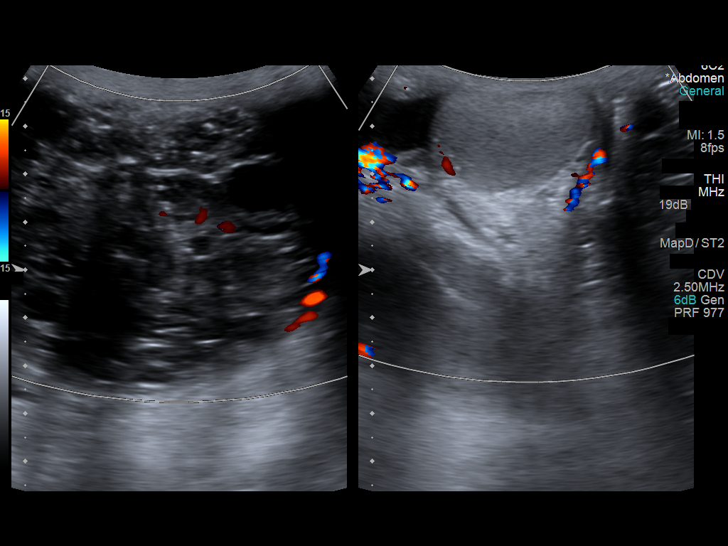

[13 of 25 positions shown; findings below may reference images not displayed]

FINDINGS: Right testicle

Measurements: Enlarged, 9.1 x 6.1 x 8.4 cm. Abnormal appearance,
demonstrating extensive microlithiasis, cystic areas, solid
components, and areas of mixed isoechogenicity and hypoechogenicity.
Largest cystic component 2.3 x 1.3 x 2.2 cm. Largest discrete solid
component measures 1.7 x 1.9 x 1.6 cm. Blood flow present within the
enlarged RIGHT testis on color Doppler imaging.

Left testicle

Measurements: 4.7 x 2.2 x 3.8 cm. Diffuse microlithiasis. No
discrete mass. Internal blood flow present on color Doppler imaging.

Right epididymis:  Not visualized

Left epididymis:  Normal in size and appearance.

Hydrocele:  Small LEFT hydrocele.  No RIGHT hydrocele.

Varicocele:  No definite varicocele identified

Pulsed Doppler interrogation of both testes demonstrates low
resistance arterial and venous waveforms in the LEFT testis. Low
resistance arterial and venous waveforms are present within the
enlarged abnormal appearing RIGHT testis as well.
IMPRESSION: Normal LEFT testis.

Small LEFT hydrocele.

Markedly enlarged heterogeneous RIGHT testis 9.1 x 6.1 x 8.4 cm
containing solid areas, cystic areas, microcalcifications, and
nonspecific heterogeneous areas of mixed echogenicity.

Lesion is highly suspicious for testicular neoplasm.

Appearance is atypical for advanced tubular ectasia of the rete
testis.

Granulomatous processes could cause a similar appearance but
considered much less likely.

Urologic evaluation recommended.

Findings called to Dr.  Miyabi Manek on 01/25/2014 at 9386 hr.

## 2016-06-26 IMAGING — XA IR FLUORO GUIDE CV LINE*R*
1 series · 1 of 1 positions shown · non-contrast
Comparison: none

CLINICAL DATA: Metastatic testicular cancer, mixed germ cell tumor,
abnormal Right retroperitoneal adenopathy

[Series 300: line placements · 1 of 1 slices shown]
[im 1/1]
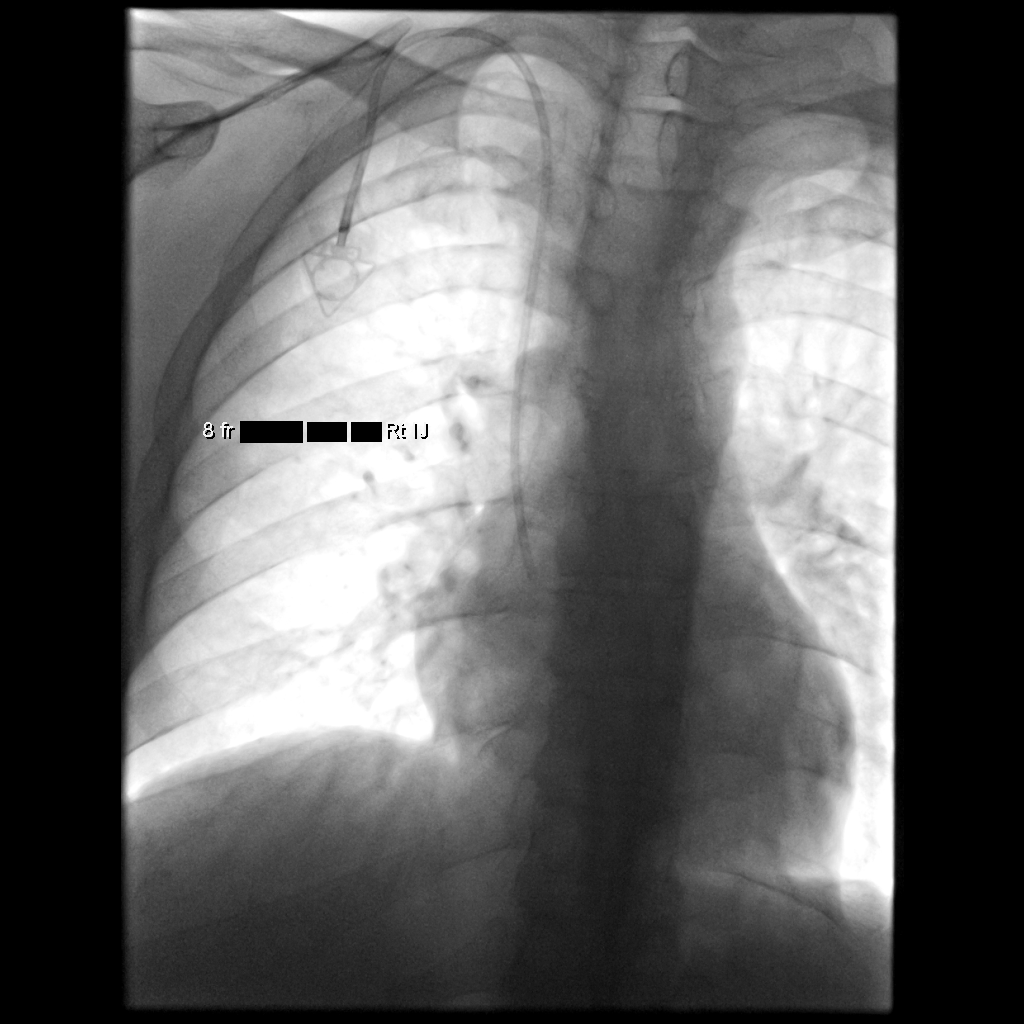

[1 of 1 positions shown; findings below may reference images not displayed]

EXAM:
RIGHT INTERNAL JUGULAR SINGLE LUMEN POWER PORT CATHETER INSERTION

Radiologist:  Pueng, Mariuxi Elizabeth

Guidance:  Ultrasound of fluoroscopic

FLUOROSCOPY TIME:  54 seconds

MEDICATIONS AND MEDICAL HISTORY:
2 g Ancefadministered within 1 hour of the procedure.4 mg Versed,
200 mcg fentanyl

ANESTHESIA/SEDATION:
30 min

CONTRAST:  None.

COMPLICATIONS:
None mean

PROCEDURE:
Informed consent was obtained from the patient following explanation
of the procedure, risks, benefits and alternatives. The patient
understands, agrees and consents for the procedure. All questions
were addressed. A time out was performed.

Maximal barrier sterile technique utilized including caps, mask,
sterile gowns, sterile gloves, large sterile drape, hand hygiene,
and 2% chlorhexidine scrub.

Under sterile conditions and local anesthesia, right internal
jugular micropuncture venous access was performed. Access was
performed with ultrasound. Images were obtained for documentation. A
guide wire was inserted followed by a transitional dilator. This
allowed insertion of a guide wire and catheter into the IVC.
Measurements were obtained from the SVC / RA junction back to the
right IJ venotomy site. In the right infraclavicular chest, a
subcutaneous pocket was created over the second anterior rib. This
was done under sterile conditions and local anesthesia. 1% lidocaine
with epinephrine was utilized for this. A 2.5 cm incision was made
in the skin. Blunt dissection was performed to create a subcutaneous
pocket over the right pectoralis major muscle. The pocket was
flushed with saline vigorously. There was adequate hemostasis. The
port catheter was assembled and checked for leakage. The port
catheter was secured in the pocket with two retention sutures. The
tubing was tunneled subcutaneously to the right venotomy site and
inserted into the SVC/RA junction through a valved peel-away sheath.
Position was confirmed with fluoroscopy. Images were obtained for
documentation. The patient tolerated the procedure well. No
immediate complications. Incisions were closed in a two layer
fashion with 4 - 0 Vicryl suture. Dermabond was applied to the skin.
The port catheter was accessed, blood was aspirated followed by
saline and heparin flushes. Needle was removed. A dry sterile
dressing was applied.
IMPRESSION: Ultrasound and fluoroscopically guided right internal jugular single
lumen power port catheter insertion. Tip in the SVC/RA junction.
Catheter ready for use.

## 2017-04-28 NOTE — Telephone Encounter (Signed)
Chart review.

## 2017-05-14 ENCOUNTER — Emergency Department (HOSPITAL_COMMUNITY)
Admission: EM | Admit: 2017-05-14 | Discharge: 2017-05-14 | Disposition: A | Discharge status: Home / Self Care | Payer: Self-pay | Attending: Emergency Medicine | Admitting: Emergency Medicine

## 2017-05-14 ENCOUNTER — Encounter (HOSPITAL_COMMUNITY): Payer: Self-pay | Admitting: Emergency Medicine

## 2017-05-14 DIAGNOSIS — Z79891 Long term (current) use of opiate analgesic: Secondary | ICD-10-CM | POA: Insufficient documentation

## 2017-05-14 DIAGNOSIS — K047 Periapical abscess without sinus: Secondary | ICD-10-CM | POA: Insufficient documentation

## 2017-05-14 DIAGNOSIS — F17210 Nicotine dependence, cigarettes, uncomplicated: Secondary | ICD-10-CM | POA: Insufficient documentation

## 2017-05-14 MED ORDER — ACETAMINOPHEN 325 MG PO TABS
650.0000 mg | ORAL_TABLET | Freq: Once | ORAL | Status: AC
  Administered 2017-05-14: 650 mg via ORAL
  Filled 2017-05-14: qty 2

## 2017-05-14 MED ORDER — ONDANSETRON HCL 4 MG PO TABS
4.0000 mg | ORAL_TABLET | Freq: Once | ORAL | Status: AC
  Administered 2017-05-14: 4 mg via ORAL
  Filled 2017-05-14: qty 1

## 2017-05-14 MED ORDER — IBUPROFEN 600 MG PO TABS: 600.0000 mg | ORAL_TABLET | Freq: Four times a day (QID) | ORAL | 0 refills | Status: DC

## 2017-05-14 MED ORDER — IBUPROFEN 800 MG PO TABS
800.0000 mg | ORAL_TABLET | Freq: Once | ORAL | Status: DC
  Filled 2017-05-14: qty 1

## 2017-05-14 MED ORDER — CLINDAMYCIN HCL 150 MG PO CAPS
300.0000 mg | ORAL_CAPSULE | Freq: Once | ORAL | Status: AC
Start: 2017-05-14 — End: 2017-05-14
  Administered 2017-05-14: 300 mg via ORAL
  Filled 2017-05-14: qty 2

## 2017-05-14 MED ORDER — AMOXICILLIN 500 MG PO CAPS: 500.0000 mg | ORAL_CAPSULE | Freq: Three times a day (TID) | ORAL | 0 refills | Status: DC

## 2017-05-14 NOTE — ED Triage Notes (Signed)
Pt reports left jaw pain radiating to left ear. Pt denies any known injury and reports "my teeth are in bad shape." nad noted. Pt denies any known fever, chills.

## 2017-05-14 NOTE — ED Provider Notes (Signed)
[facility] DEPT Provider Note   CSN: [id number] Arrival date & time: 05/14/17  0905     History   Chief Complaint Chief Complaint  Patient presents with  . Dental Pain    HPI Jeremy Clay is a 36 y.o. male.   Dental Pain   This is a chronic problem. The current episode started more than 2 days ago. The problem has been gradually worsening. The pain is moderate. He has tried nothing for the symptoms.    Past Medical History:  Diagnosis Date  . Herniated disc   . 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   . 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[place]) 02/07/2014  . Testicular mass    right    Patient Active Problem List   Diagnosis Date Noted  . Fatigue 03/23/2014  . Weight loss 03/23/2014  . Altered taste 03/23/2014  . Nausea alone 03/23/2014  . Diarrhea 03/23/2014  . Mucositis (ulcerative) due to antineoplastic therapy 03/23/2014  . 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[place]) 02/07/2014  . ANKLE SPRAIN, LEFT 10/11/2008    Past Surgical History:  Procedure Laterality Date  . ORCHIECTOMY Right 02/07/2014   Procedure: RIGHT RADICAL ORCHIECTOMY;  Surgeon: Malka So, MD;  Location: [facility];  Service: Urology;  Laterality: Right;       Home Medications    Prior to Admission medications   Medication Sig Start Date End Date Taking? Authorizing Provider  ibuprofen (ADVIL,MOTRIN) 800 MG tablet Take 1 tablet (800 mg total) by mouth 3 (three) times daily. 06/19/15   Lily Kocher, PA-C  methadone (DOLOPHINE) 10 MG/ML solution Take 75 mg by mouth daily.    [provider]  prochlorperazine (COMPAZINE) 10 MG tablet Take 1 tablet (10 mg total) by mouth every 6 (six) hours as needed for nausea or vomiting. Patient not taking: Reported on 03/29/2015 04/25/14   Wyatt Portela, MD    Family History Family History  Problem Relation Age of Onset  . Diabetes Other   . Heart failure Other     Social History Social History    Substance Use Topics  . Smoking status: Current Some Day Smoker    Packs/day: 0.25    Years: 13.00    Types: Cigarettes  . Smokeless tobacco: Never Used  . Alcohol use No     Allergies   Tegaderm ag mesh [silver]   Review of Systems Review of Systems  Constitutional: Negative for activity change and appetite change.  HENT: Positive for dental problem and facial swelling. Negative for congestion, ear discharge, ear pain, nosebleeds, rhinorrhea, sneezing and tinnitus.   Eyes: Negative for photophobia, pain and discharge.  Respiratory: Negative for cough, choking, shortness of breath and wheezing.   Cardiovascular: Negative for chest pain, palpitations and leg swelling.  Gastrointestinal: Negative for abdominal pain, blood in stool, constipation, diarrhea, nausea and vomiting.  Genitourinary: Negative for difficulty urinating, dysuria, flank pain, frequency and hematuria.  Musculoskeletal: Negative for back pain, gait problem, myalgias and neck pain.  Skin: Negative for color change, rash and wound.  Neurological: Negative for dizziness, seizures, syncope, facial asymmetry, speech difficulty, weakness and numbness.  Hematological: Negative for adenopathy. Does not bruise/bleed easily.  Psychiatric/Behavioral: Negative for agitation, confusion, hallucinations, self-injury and suicidal ideas. The patient is not nervous/anxious.      Physical Exam Updated Vital Signs BP (!) 137/106 (BP Location: Left Arm)   Pulse 87   Temp 97.9 F (36.6 C) (Oral)   Resp 18  Ht 5\' 10"  (1.778 m)   Wt 83.9 kg (185 lb)   SpO2 99%   BMI 26.54 kg/m   Physical Exam  Constitutional: He is oriented to person, place, and time. He appears well-developed and well-nourished.  Non-toxic appearance.  HENT:  Head: Normocephalic.  Right Ear: Tympanic membrane and external ear normal.  Left Ear: Tympanic membrane and external ear normal.  Multiple dental caries present, left greater than right. Swelling  at the left lower canine and premolar area. No visible abscess. Airway patent. No swelling under the tongue.  Eyes: Pupils are equal, round, and reactive to light. EOM and lids are normal.  Neck: Normal range of motion. Neck supple. Carotid bruit is not present.  Cardiovascular: Normal rate, regular rhythm, normal heart sounds, intact distal pulses and normal pulses.   Pulmonary/Chest: Breath sounds normal. No respiratory distress.  Abdominal: Soft. Bowel sounds are normal. There is no tenderness. There is no guarding.  Musculoskeletal: Normal range of motion.  Lymphadenopathy:       Head (right side): No submandibular adenopathy present.       Head (left side): No submandibular adenopathy present.    He has no cervical adenopathy.  Neurological: He is alert and oriented to person, place, and time. He has normal strength. No cranial nerve deficit or sensory deficit.  Skin: Skin is warm and dry.  Psychiatric: He has a normal mood and affect. His speech is normal.  Nursing note and vitals reviewed.    ED Treatments / Results  Labs (all labs ordered are listed, but only abnormal results are displayed) Labs Reviewed - No data to display  EKG  EKG Interpretation None       Radiology No results found.  Procedures Procedures (including critical care time)  Medications Ordered in ED Medications - No data to display   Initial Impression / Assessment and Plan / ED Course  I have reviewed the triage vital signs and the nursing notes.  Pertinent labs & imaging results that were available during my care of the patient were reviewed by me and considered in my medical decision making (see chart for details).       Final Clinical Impressions(s) / ED Diagnoses MDM Patient's blood pressure is elevated at 137/106. I've asked the patient to see the physicians at the free clinic or the health department to assist with this. The patient has multiple dental caries, swelling and evidence  of infection at the left canine and left premolar area. Patient will be treated with Amoxil 3 times daily, ibuprofen 4 times daily. The patient states he has been in contact with the dental school at [facility], and is waiting for his appointment.The patient will return to the emergency department sooner if any changes, problems, or concerns.   Final diagnoses:  Dental infection    New Prescriptions New Prescriptions   No medications on file     Lily Kocher, Hershal Coria 05/14/17 1002    Milton Ferguson, MD 05/14/17 1553

## 2017-05-14 NOTE — Discharge Instructions (Signed)
Your examination reveals her blood pressure to be elevated at 137/106. Please have this rechecked by thehealth department, or the free clinic. You have multiple dental caries There is evidence of infection. It is important that she see the dentist systems possible. Please use Amoxil 3 times daily Use ibuprofen with breakfast, lunch, dinner, and at bedtime.

## 2017-09-09 ENCOUNTER — Other Ambulatory Visit: Payer: Self-pay | Admitting: Oncology

## 2017-09-09 ENCOUNTER — Telehealth: Payer: Self-pay | Admitting: *Deleted

## 2017-09-09 DIAGNOSIS — C6291 Malignant neoplasm of right testis, unspecified whether descended or undescended: Secondary | ICD-10-CM

## 2017-09-09 NOTE — Telephone Encounter (Signed)
Message to scheduling sent. He will have lab + Ct on 1/24. MD 1/31

## 2017-09-09 NOTE — Telephone Encounter (Signed)
Patient states he now has insurance and would like to f/u with dr Alen Blew and get yearly CT scan. No appts presently.

## 2017-09-10 ENCOUNTER — Telehealth: Payer: Self-pay | Admitting: Oncology

## 2017-09-10 NOTE — Telephone Encounter (Signed)
Scheduled appt per 1/2 sch message - Patient is aware of appt added and knows that [facility] to contact him for Ct schedule.

## 2017-10-01 ENCOUNTER — Inpatient Hospital Stay: Payer: BLUE CROSS/BLUE SHIELD | Attending: Oncology

## 2017-10-01 ENCOUNTER — Ambulatory Visit (HOSPITAL_COMMUNITY)
Admission: RE | Admit: 2017-10-01 | Discharge: 2017-10-01 | Disposition: A | Discharge status: Home / Self Care | Payer: BLUE CROSS/BLUE SHIELD | Source: Ambulatory Visit | Attending: Oncology | Admitting: Oncology

## 2017-10-01 DIAGNOSIS — Z9079 Acquired absence of other genital organ(s): Secondary | ICD-10-CM | POA: Insufficient documentation

## 2017-10-01 DIAGNOSIS — R590 Localized enlarged lymph nodes: Secondary | ICD-10-CM | POA: Diagnosis not present

## 2017-10-01 DIAGNOSIS — C6290 Malignant neoplasm of unspecified testis, unspecified whether descended or undescended: Secondary | ICD-10-CM | POA: Insufficient documentation

## 2017-10-01 DIAGNOSIS — C6291 Malignant neoplasm of right testis, unspecified whether descended or undescended: Secondary | ICD-10-CM

## 2017-10-01 DIAGNOSIS — Z9221 Personal history of antineoplastic chemotherapy: Secondary | ICD-10-CM | POA: Diagnosis not present

## 2017-10-01 LAB — COMPREHENSIVE METABOLIC PANEL
ALT: 24 U/L (ref 0–55)
AST: 22 U/L (ref 5–34)
Albumin: 3.9 g/dL (ref 3.5–5.0)
Alkaline Phosphatase: 74 U/L (ref 40–150)
Anion gap: 8 (ref 3–11)
BUN: 19 mg/dL (ref 7–26)
CO2: 27 mmol/L (ref 22–29)
Calcium: 9.2 mg/dL (ref 8.4–10.4)
Chloride: 103 mmol/L (ref 98–109)
Creatinine, Ser: 0.86 mg/dL (ref 0.70–1.30)
GFR calc Af Amer: >60 mL/min (ref >60)
GFR calc non Af Amer: >60 mL/min (ref >60)
GLUCOSE: 82 mg/dL (ref 70–140)
POTASSIUM: 4.2 mmol/L (ref 3.5–5.1)
Sodium: 138 mmol/L (ref 136–145)
TOTAL PROTEIN: 8.3 g/dL (ref 6.4–8.3)

## 2017-10-01 LAB — CBC WITH DIFFERENTIAL/PLATELET
BASOS ABS: 0 10*3/uL (ref 0.0–0.1)
BASOS PCT: 0 %
Eosinophils Absolute: 0.1 10*3/uL (ref 0.0–0.5)
Eosinophils Relative: 1 %
HEMATOCRIT: 37.3 % — AB (ref 38.4–49.9)
Hemoglobin: 12.1 g/dL — ABNORMAL LOW (ref 13.0–17.1)
Lymphocytes Relative: 33 %
Lymphs Abs: 2.4 10*3/uL (ref 0.9–3.3)
MCH: 27.6 pg (ref 27.2–33.4)
MCHC: 32.4 g/dL (ref 32.0–36.0)
MCV: 85 fL (ref 79.3–98.0)
Monocytes Absolute: 0.6 10*3/uL (ref 0.1–0.9)
Monocytes Relative: 8 %
NEUTROS ABS: 4.2 10*3/uL (ref 1.5–6.5)
NEUTROS PCT: 58 %
Platelets: 143 10*3/uL (ref 140–400)
RBC: 4.39 MIL/uL (ref 4.20–5.82)
RDW: 13.2 % (ref 11.0–15.6)
WBC: 7.4 10*3/uL (ref 4.0–10.3)

## 2017-10-01 LAB — LACTATE DEHYDROGENASE: LDH: 179 U/L (ref 125–245)

## 2017-10-01 MED ORDER — IOPAMIDOL (ISOVUE-300) INJECTION 61%
100.0000 mL | Freq: Once | INTRAVENOUS | Status: AC | PRN
  Administered 2017-10-01: 100 mL via INTRAVENOUS

## 2017-10-02 LAB — BETA HCG QUANT (REF LAB)

## 2017-10-02 LAB — AFP TUMOR MARKER: AFP, SERUM, TUMOR MARKER: 3.1 ng/mL (ref 0.0–8.3)

## 2017-10-08 ENCOUNTER — Telehealth: Payer: Self-pay | Admitting: Oncology

## 2017-10-08 ENCOUNTER — Inpatient Hospital Stay (HOSPITAL_BASED_OUTPATIENT_CLINIC_OR_DEPARTMENT_OTHER): Payer: BLUE CROSS/BLUE SHIELD | Admitting: Oncology

## 2017-10-08 VITALS — BP 122/77 | HR 69 | Temp 97.8°F | Resp 20 | Ht 70.0 in | Wt 183.1 lb

## 2017-10-08 DIAGNOSIS — R590 Localized enlarged lymph nodes: Secondary | ICD-10-CM | POA: Diagnosis not present

## 2017-10-08 DIAGNOSIS — C6290 Malignant neoplasm of unspecified testis, unspecified whether descended or undescended: Secondary | ICD-10-CM | POA: Diagnosis not present

## 2017-10-08 DIAGNOSIS — C6291 Malignant neoplasm of right testis, unspecified whether descended or undescended: Secondary | ICD-10-CM

## 2017-10-08 DIAGNOSIS — Z9221 Personal history of antineoplastic chemotherapy: Secondary | ICD-10-CM | POA: Diagnosis not present

## 2017-10-08 NOTE — Progress Notes (Signed)
Hematology and Oncology Follow Up Visit  Jeremy Clay [id number] 02/06/1981 37 y.o. 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  He had stage II disease at the time of diagnosis with lymphadenopathy.   Prior Therapy:  He is status post radical orchiectomy on 02/07/2014. The tumor measured 7.3 cm showed 10% embryonal, and 80% teratoma and 10% yolk sac tumor. He is S/P BEP chemotherapy for 3 cycles completed in September 2015.  Current therapy: Observation and surveillance.  He was referred for post chemotherapy RPLND but failed to follow-up after that.  Interim History:  Mr. Ishler here for a follow-up.  Since the last visit, he has lost his insurance and has failed to show up for his subsequent appointments.  He regained his insurance at this time and have resumed all work-related duties.  He denies any delayed complications related to chemotherapy.  He denies any peripheral neuropathy or excessive pain.  He continues to perform activities of daily living without any decline.   He did not report any fevers at this time.  He does not report any chest pain or difficulty breathing. Did not report any cough or hemoptysis. He is not reporting any chills or sweats or weight loss. Does not report any constipation or diarrhea or hematochezia. Is on current frequency urgency or hesitancy. Does not report any skeletal complaints. Is not reporting any lymphadenopathy or petechiae. Review of systems is negative.   Medications: I have reviewed the patient's current medications.  Current Outpatient Medications  Medication Sig Dispense Refill  . amoxicillin (AMOXIL) 500 MG capsule Take 1 capsule (500 mg total) by mouth 3 (three) times daily. 21 capsule 0  . ibuprofen (ADVIL,MOTRIN) 600 MG  tablet Take 1 tablet (600 mg total) by mouth 4 (four) times daily. 30 tablet 0  . methadone (DOLOPHINE) 10 MG/ML solution Take 75 mg by mouth daily.    . prochlorperazine (COMPAZINE) 10 MG tablet Take 1 tablet (10 mg total) by mouth every 6 (six) hours as needed for nausea or vomiting. (Patient not taking: Reported on 03/29/2015) 30 tablet 0   No current facility-administered medications for this visit.      Allergies:  Allergies  Allergen Reactions  . Tegaderm Ag Mesh [Silver] Rash    Redness & irritation noted from IV site dressing 24 hrs after d/c'd     Past Medical History, Surgical history, Social history, and Family History were reviewed and updated.   Physical Exam: Blood pressure 122/77, pulse 69, temperature 97.8 F (36.6 C), temperature source Oral, resp. rate 20, height 5\' 10"  (1.778 m), weight 183 lb 1.6 oz (83.1 kg), SpO2 99 %.   ECOG: 0 General appearance: Comfortable appearing gentleman appeared without distress. Head: Normocephalic, without obvious abnormality  Oropharynx: No oral ulcers or lesions. Eyes: No scleral icterus. Lymph nodes: Cervical, supraclavicular, and axillary nodes normal. Heart:regular rate and rhythm, S1, S2 normal, no murmur, click, rub or gallop Lung:chest clear, no wheezing, rales, normal symmetric air entry Abdomin: soft, non-tender, without masses or organomegaly .  Musculoskeletal: No joint deformity or effusion. Skin: No rashes or lesions.   Lab Results: Lab Results  Component Value Date   WBC 7.4 10/01/2017   HGB 12.1 (L) 10/01/2017   HCT 37.3 (L) 10/01/2017   MCV 85.0 10/01/2017   PLT 143 10/01/2017  Chemistry      Component Value Date/Time   NA 138 10/01/2017 1301   NA 138 03/06/2016 1611   K 4.2 10/01/2017 1301   K 3.9 03/06/2016 1611   CL 103 10/01/2017 1301   CO2 27 10/01/2017 1301   CO2 24 03/06/2016 1611   BUN 19 10/01/2017 1301   BUN 12.3 03/06/2016 1611   CREATININE 0.86 10/01/2017 1301   CREATININE 0.9  03/06/2016 1611      Component Value Date/Time   CALCIUM 9.2 10/01/2017 1301   CALCIUM 9.3 03/06/2016 1611   ALKPHOS 74 10/01/2017 1301   ALKPHOS 77 03/06/2016 1611   AST 22 10/01/2017 1301   AST 23 03/06/2016 1611   ALT 24 10/01/2017 1301   ALT 29 03/06/2016 1611   BILITOT <0.2 (L) 10/01/2017 1301   BILITOT <0.30 03/06/2016 1611     EXAM: CT CHEST, ABDOMEN, AND PELVIS WITH CONTRAST  TECHNIQUE: Multidetector CT imaging of the chest, abdomen and pelvis was performed following the standard protocol during bolus administration of intravenous contrast.  CONTRAST:  117mL ISOVUE-300 IOPAMIDOL (ISOVUE-300) INJECTION 61%  COMPARISON:  AP CT on 03/06/2016 and chest CT on 09/04/2015  FINDINGS: CT CHEST FINDINGS  Cardiovascular: No acute findings.  Mediastinum/Lymph Nodes: No masses or pathologically enlarged lymph nodes identified.  Lungs/Pleura: No pulmonary infiltrate or mass identified. No effusion present.  Musculoskeletal:  No suspicious bone lesions identified.  CT ABDOMEN AND PELVIS FINDINGS  Hepatobiliary: No masses identified. Gallbladder is unremarkable.  Pancreas:  No mass or inflammatory changes.  Spleen:  Within normal limits in size and appearance.  Adrenals/Urinary tract:  No masses or hydronephrosis.  Stomach/Bowel: No evidence of obstruction, inflammatory process, or abnormal fluid collections.  Vascular/Lymphatic: Retroperitoneal lymph node in the aortocaval space shows decreased attenuation since previous study but is stable in size measuring 2.8 x 1.7 cm on image 81/2. No new or increased areas of lymphadenopathy identified. No abdominal aortic aneurysm.  Reproductive:  No mass or other significant abnormality identified.  Other:  Stable postop changes from previous right orchiectomy.  Musculoskeletal:  No suspicious bone lesions identified.  IMPRESSION: Stable size of mild right retroperitoneal lymphadenopathy. No new  or progressive disease identified.  No evidence of thoracic metastatic disease.    Impression and Plan:  37 year old gentleman with the following issues:  1.  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 He is status post 3 cycles of BEP chemotherapy completed in 05/2014.   He has failed to follow-up in the last 2 years but reestablish care recently.  Laboratory data and CT scan obtained on January 2019 were personally reviewed and showed no evidence of progressive disease.  He does have residual aortocaval lymph node that has not changed over the last few years.  Differential diagnosis includes scar tissues versus residual teratoma.  The plan is to continue with active surveillance and repeat imaging studies in 1 year.  Post chemo RPLND will be considered if there is any growth noted at the time.  2. IV access: Port-A-Cath removed without any complications.  3. Followup: In 12 months to repeat imaging studies.  15  minutes was spent with the patient face-to-face today.  More than 50% of time was dedicated to patient counseling, education and coordination of his care.    Zola Button, MD 1/31/20191:50 PM

## 2017-10-08 NOTE — Telephone Encounter (Signed)
Scheduled appt per 1/31 los - Gave patient AVS and calender per los. 

## 2018-10-01 ENCOUNTER — Ambulatory Visit (HOSPITAL_COMMUNITY)
Admission: RE | Admit: 2018-10-01 | Discharge: 2018-10-01 | Disposition: A | Discharge status: Home / Self Care | Payer: Self-pay | Source: Ambulatory Visit | Attending: Oncology | Admitting: Oncology

## 2018-10-01 ENCOUNTER — Inpatient Hospital Stay: Payer: Self-pay | Attending: Oncology

## 2018-10-01 DIAGNOSIS — Z9221 Personal history of antineoplastic chemotherapy: Secondary | ICD-10-CM | POA: Insufficient documentation

## 2018-10-01 DIAGNOSIS — R590 Localized enlarged lymph nodes: Secondary | ICD-10-CM | POA: Insufficient documentation

## 2018-10-01 DIAGNOSIS — Z791 Long term (current) use of non-steroidal anti-inflammatories (NSAID): Secondary | ICD-10-CM | POA: Insufficient documentation

## 2018-10-01 DIAGNOSIS — C6291 Malignant neoplasm of right testis, unspecified whether descended or undescended: Secondary | ICD-10-CM | POA: Insufficient documentation

## 2018-10-01 DIAGNOSIS — Z9079 Acquired absence of other genital organ(s): Secondary | ICD-10-CM | POA: Insufficient documentation

## 2018-10-01 DIAGNOSIS — Z8547 Personal history of malignant neoplasm of testis: Secondary | ICD-10-CM | POA: Insufficient documentation

## 2018-10-01 LAB — CMP (CANCER CENTER ONLY)
ALBUMIN: 3.9 g/dL (ref 3.5–5.0)
ALK PHOS: 68 U/L (ref 38–126)
ALT: 17 U/L (ref 0–44)
ANION GAP: 9 (ref 5–15)
AST: 18 U/L (ref 15–41)
BILIRUBIN TOTAL: 0.3 mg/dL (ref 0.3–1.2)
BUN: 17 mg/dL (ref 6–20)
CALCIUM: 9.5 mg/dL (ref 8.9–10.3)
CO2: 28 mmol/L (ref 22–32)
Chloride: 101 mmol/L (ref 98–111)
Creatinine: 0.84 mg/dL (ref 0.61–1.24)
GFR, Est AFR Am: >60 mL/min (ref >60)
Glucose, Bld: 86 mg/dL (ref 70–99)
Potassium: 4 mmol/L (ref 3.5–5.1)
Sodium: 138 mmol/L (ref 135–145)
TOTAL PROTEIN: 7.9 g/dL (ref 6.5–8.1)

## 2018-10-01 LAB — CBC WITH DIFFERENTIAL (CANCER CENTER ONLY)
Abs Immature Granulocytes: 0.02 10*3/uL (ref 0.00–0.07)
BASOS PCT: 1 %
Basophils Absolute: 0 10*3/uL (ref 0.0–0.1)
EOS ABS: 0.2 10*3/uL (ref 0.0–0.5)
Eosinophils Relative: 3 %
HCT: 36 % — ABNORMAL LOW (ref 39.0–52.0)
Hemoglobin: 11.9 g/dL — ABNORMAL LOW (ref 13.0–17.0)
Immature Granulocytes: 0 %
Lymphocytes Relative: 46 %
Lymphs Abs: 3.3 10*3/uL (ref 0.7–4.0)
MCH: 27.5 pg (ref 26.0–34.0)
MCHC: 33.1 g/dL (ref 30.0–36.0)
MCV: 83.3 fL (ref 80.0–100.0)
MONO ABS: 0.6 10*3/uL (ref 0.1–1.0)
Monocytes Relative: 8 %
Neutro Abs: 3.1 10*3/uL (ref 1.7–7.7)
Neutrophils Relative %: 42 %
PLATELETS: 155 10*3/uL (ref 150–400)
RBC: 4.32 MIL/uL (ref 4.22–5.81)
RDW: 12.6 % (ref 11.5–15.5)
WBC: 7.2 10*3/uL (ref 4.0–10.5)
nRBC: 0 % (ref 0.0–0.2)

## 2018-10-01 LAB — LACTATE DEHYDROGENASE: LDH: 170 U/L (ref 98–192)

## 2018-10-01 MED ORDER — IOHEXOL 300 MG/ML  SOLN
100.0000 mL | Freq: Once | INTRAMUSCULAR | Status: AC | PRN
  Administered 2018-10-01: 100 mL via INTRAVENOUS

## 2018-10-01 MED ORDER — SODIUM CHLORIDE (PF) 0.9 % IJ SOLN
INTRAMUSCULAR | Status: AC
  Filled 2018-10-01: qty 50

## 2018-10-02 LAB — BETA HCG QUANT (REF LAB): hCG Quant: <1 m[IU]/mL (ref 0–3)

## 2018-10-02 LAB — AFP TUMOR MARKER: AFP, Serum, Tumor Marker: 2.6 ng/mL (ref 0.0–8.3)

## 2018-10-08 ENCOUNTER — Inpatient Hospital Stay (HOSPITAL_BASED_OUTPATIENT_CLINIC_OR_DEPARTMENT_OTHER): Payer: Self-pay | Admitting: Oncology

## 2018-10-08 ENCOUNTER — Telehealth: Payer: Self-pay | Admitting: Oncology

## 2018-10-08 VITALS — BP 122/81 | HR 72 | Temp 98.1°F | Resp 17 | Ht 70.0 in | Wt 185.3 lb

## 2018-10-08 DIAGNOSIS — Z791 Long term (current) use of non-steroidal anti-inflammatories (NSAID): Secondary | ICD-10-CM

## 2018-10-08 DIAGNOSIS — R590 Localized enlarged lymph nodes: Secondary | ICD-10-CM

## 2018-10-08 DIAGNOSIS — Z9079 Acquired absence of other genital organ(s): Secondary | ICD-10-CM

## 2018-10-08 DIAGNOSIS — C6291 Malignant neoplasm of right testis, unspecified whether descended or undescended: Secondary | ICD-10-CM

## 2018-10-08 DIAGNOSIS — Z8547 Personal history of malignant neoplasm of testis: Secondary | ICD-10-CM

## 2018-10-08 DIAGNOSIS — Z9221 Personal history of antineoplastic chemotherapy: Secondary | ICD-10-CM

## 2018-10-08 NOTE — Progress Notes (Signed)
Hematology and Oncology Follow Up Visit  Jeremy Clay [id number] Mar 03, 1981 38 y.o. 10/08/2018 1:25 PM   Principle Diagnosis: 38 year old man with stage II nonseminomatous testicular cancer diagnosed in June of 2015.   He was found to have teratoma as well as embryonal component.    Prior Therapy:  He is status post radical orchiectomy on 02/07/2014. The tumor measured 7.3 cm showed 10% embryonal, and 80% teratoma and 10% yolk sac tumor. He is S/P BEP chemotherapy for 3 cycles completed in September 2015.  Current therapy: He was referred for post chemotherapy RPLND but failed to follow-up after that.  He remains on active surveillance.  Interim History:  Mr. Jeremy Clay returns today for a repeat evaluation.  Since the last visit, he reports no major changes in his health.  He continues to feel well without any evidence to suggest recurrent disease.  He denies any abdominal pain, distention or early satiety.  He denies any testicular masses or lesions.  He is taking steps to take care of himself more including establishing care with a dentist and an ophthalmologist.  Continues to work full-time without any ability to do so.  He denies any respiratory complaints.  Patient denied any alteration mental status, neuropathy, confusion or dizziness.  Denies any headaches or lethargy.  Denies any night sweats, weight loss or changes in appetite.  Denied orthopnea, dyspnea on exertion or chest discomfort.  Denies shortness of breath, difficulty breathing hemoptysis or cough.  Denies any abdominal distention, nausea, early satiety or dyspepsia.  Denies any hematuria, frequency, dysuria or nocturia.  Denies any skin irritation, dryness or rash.  Denies any ecchymosis or petechiae.  Denies any lymphadenopathy or clotting.  Denies any heat or cold intolerance.  Denies any anxiety or depression.  Remaining review of system is negative.     Medications: I have reviewed the patient's current medications.   Current Outpatient Medications  Medication Sig Dispense Refill  . amoxicillin (AMOXIL) 500 MG capsule Take 1 capsule (500 mg total) by mouth 3 (three) times daily. (Patient not taking: Reported on 10/08/2017) 21 capsule 0  . ibuprofen (ADVIL,MOTRIN) 600 MG tablet Take 1 tablet (600 mg total) by mouth 4 (four) times daily. 30 tablet 0  . methadone (DOLOPHINE) 10 MG/ML solution Take 75 mg by mouth daily.     No current facility-administered medications for this visit.      Allergies:  Allergies  Allergen Reactions  . Tegaderm Ag Mesh [Silver] Rash    Redness & irritation noted from IV site dressing 24 hrs after d/c'd     Past Medical History, Surgical history, Social history, and Family History were reviewed and updated.   Physical Exam:  Blood pressure 122/81, pulse 72, temperature 98.1 F (36.7 C), temperature source Oral, resp. rate 17, height 5\' 10"  (1.778 m), weight 185 lb 4.8 oz (84.1 kg), SpO2 100 %.   ECOG: 0   General appearance: Alert, awake without any distress. Head: Atraumatic without abnormalities Oropharynx: Without any thrush or ulcers. Eyes: No scleral icterus. Lymph nodes: No lymphadenopathy noted in the cervical, supraclavicular, or axillary nodes Heart:regular rate and rhythm, without any murmurs or gallops.   Lung: Clear to auscultation without any rhonchi, wheezes or dullness to percussion. Abdomin: Soft, nontender without any shifting dullness or ascites. Musculoskeletal: No clubbing or cyanosis. Neurological: No motor or sensory deficits. Skin: No rashes or lesions.    Lab Results: Lab Results  Component Value Date   WBC 7.2 10/01/2018   HGB 11.9 (L)  10/01/2018   HCT 36.0 (L) 10/01/2018   MCV 83.3 10/01/2018   PLT 155 10/01/2018     Chemistry      Component Value Date/Time   NA 138 10/01/2018 1332   NA 138 03/06/2016 1611   K 4.0 10/01/2018 1332   K 3.9 03/06/2016 1611   CL 101 10/01/2018 1332   CO2 28 10/01/2018 1332   CO2 24  03/06/2016 1611   BUN 17 10/01/2018 1332   BUN 12.3 03/06/2016 1611   CREATININE 0.84 10/01/2018 1332   CREATININE 0.9 03/06/2016 1611      Component Value Date/Time   CALCIUM 9.5 10/01/2018 1332   CALCIUM 9.3 03/06/2016 1611   ALKPHOS 68 10/01/2018 1332   ALKPHOS 77 03/06/2016 1611   AST 18 10/01/2018 1332   AST 23 03/06/2016 1611   ALT 17 10/01/2018 1332   ALT 29 03/06/2016 1611   BILITOT 0.3 10/01/2018 1332   BILITOT <0.30 03/06/2016 1611      Results for LEAMON, PALAU (MRN [id number]) as of 10/08/2018 13:26  Ref. Range 10/01/2017 13:01 10/01/2018 13:32  AFP, Serum, Tumor Marker Latest Ref Range: 0.0 - 8.3 ng/mL 3.1 2.6  hCG Quant Latest Ref Range: 0 - 3 mIU/mL <1 <1   EXAM: CT CHEST, ABDOMEN, AND PELVIS WITH CONTRAST  TECHNIQUE: Multidetector CT imaging of the chest, abdomen and pelvis was performed following the standard protocol during bolus administration of intravenous contrast.  CONTRAST:  146mL OMNIPAQUE IOHEXOL 300 MG/ML  SOLN  COMPARISON:  10/01/2017  FINDINGS: CT CHEST FINDINGS  Cardiovascular: No acute findings.  Mediastinum/Lymph Nodes: No masses or pathologically enlarged lymph nodes identified.  Lungs/Pleura: No pulmonary infiltrate or mass identified. No effusion present.  Musculoskeletal:  No suspicious bone lesions identified.  CT ABDOMEN AND PELVIS FINDINGS  Hepatobiliary: No masses identified. Gallbladder is unremarkable.  Pancreas:  No mass or inflammatory changes.  Spleen:  Within normal limits in size and appearance.  Adrenals/Urinary tract:  No masses or hydronephrosis.  Stomach/Bowel: No evidence of obstruction, inflammatory process, or abnormal fluid collections. Normal appendix visualized.  Vascular/Lymphatic: Low-attenuation retroperitoneal lymphadenopathy in the aortocaval space measures 2.4 x 1.9 cm, compared to 2.8 x 2.2 cm previously. No other sites of lymphadenopathy identified. No evidence of  abdominal aortic aneurysm.  Reproductive: Normal size prostate and seminal vesicles. Previous right orchiectomy.  Other:  None.  Musculoskeletal:  No suspicious bone lesions identified.  IMPRESSION: Slight decrease in mild right retroperitoneal lymphadenopathy. No new or progressive metastatic disease identified.  Impression and Plan:  38 year old man with:  1.  Testicular cancer diagnosed in 2015.  He presented with stage II nonseminomatous with embryonal and teratoma.  He received systemic chemotherapy and was not able to follow-up with RPLND after that.  He remained on active surveillance at this time and imaging studies obtained in January 2020 were personally reviewed and showed continued regression of his retroperitoneal lymph nodes.  It is likely this still harbors teratoma that appears to be not expanding.  Risks and benefits of continue active surveillance versus PLMD was discussed today and for the time being opted to continue with active surveillance.  The plan is to repeat imaging studies annually.  After imaging studies in 2021 we can consider discontinuation of imaging studies.  2.  Survivorship issues: No residual complications noted related to chemotherapy.  He is following age-appropriate health maintenance.  3. Followup: In 1 year for repeat evaluation.   15  minutes was spent with the patient face-to-face today.  More than 50% of time was spent on reviewing laboratory data, imaging studies as well as discussing treatment options and complications related therapy.    Zola Button, MD 1/31/20201:25 PM

## 2018-10-08 NOTE — Telephone Encounter (Signed)
Scheduled appt per 01/31 los.  Printed calendar and avs.  Gave patient contrast and instructions for the CT scan.

## 2019-02-14 ENCOUNTER — Encounter (HOSPITAL_COMMUNITY): Payer: Self-pay | Admitting: Emergency Medicine

## 2019-02-14 ENCOUNTER — Emergency Department (HOSPITAL_COMMUNITY): Payer: Self-pay

## 2019-02-14 ENCOUNTER — Emergency Department (HOSPITAL_COMMUNITY)
Admission: EM | Admit: 2019-02-14 | Discharge: 2019-02-14 | Disposition: A | Discharge status: Home / Self Care | Payer: Self-pay | Attending: Emergency Medicine | Admitting: Emergency Medicine

## 2019-02-14 ENCOUNTER — Other Ambulatory Visit: Payer: Self-pay

## 2019-02-14 DIAGNOSIS — R51 Headache: Secondary | ICD-10-CM | POA: Insufficient documentation

## 2019-02-14 DIAGNOSIS — Z79899 Other long term (current) drug therapy: Secondary | ICD-10-CM | POA: Insufficient documentation

## 2019-02-14 DIAGNOSIS — F17210 Nicotine dependence, cigarettes, uncomplicated: Secondary | ICD-10-CM | POA: Insufficient documentation

## 2019-02-14 DIAGNOSIS — Z8547 Personal history of malignant neoplasm of testis: Secondary | ICD-10-CM | POA: Insufficient documentation

## 2019-02-14 DIAGNOSIS — R519 Headache, unspecified: Secondary | ICD-10-CM

## 2019-02-14 MED ORDER — NAPROXEN 500 MG PO TABS: 500.0000 mg | ORAL_TABLET | Freq: Two times a day (BID) | ORAL | 0 refills | Status: DC

## 2019-02-14 MED ORDER — ONDANSETRON 4 MG PO TBDP: 4.0000 mg | ORAL_TABLET | Freq: Three times a day (TID) | ORAL | 0 refills | Status: DC | PRN

## 2019-02-14 NOTE — Discharge Instructions (Addendum)
You are seen in the emergency department for headaches.  Your CT scan was normal.  We would like you to establish care with a primary care provider and possibly follow-up with a neurologist.  Please follow-up within 1 week.  We are sending you home with naproxen and Zofran to take for your symptoms.  - Naproxen is a nonsteroidal anti-inflammatory medication that will help with pain. Be sure to take this medication as prescribed with food, 1 pill every 12 hours,  It should be taken with food, as it can cause stomach upset, and more seriously, stomach bleeding. Do not take other nonsteroidal anti-inflammatory medications with this such as Advil, Motrin, Aleve, Mobic, Goodie Powder, or Motrin.    - Zofran-this is an anti-nausea medication to take every 8 hours as needed for nausea and vomiting.  You make take Tylenol per over the counter dosing with these medications.   We have prescribed you new medication(s) today. Discuss the medications prescribed today with your pharmacist as they can have adverse effects and interactions with your other medicines including over the counter and prescribed medications. Seek medical evaluation if you start to experience new or abnormal symptoms after taking one of these medicines, seek care immediately if you start to experience difficulty breathing, feeling of your throat closing, facial swelling, or rash as these could be indications of a more serious allergic reaction    Follow-up as described above.  Return to the emergency department for new or worsening symptoms including but not limited to headache not alleviated by above medicines, new or different headache, sudden onset headache, inability to keep fluids down, fever, or any other concerns.

## 2019-02-14 NOTE — ED Notes (Signed)
ED Provider at bedside. 

## 2019-02-14 NOTE — ED Triage Notes (Signed)
Pt c/o headaches for 6 months. Reports has vomiting at times with headaches.

## 2019-02-14 NOTE — ED Provider Notes (Signed)
[facility] DEPT Provider Note   CSN: [id number] Arrival date & time: 02/14/19  1318    History   Chief Complaint Chief Complaint  Patient presents with  . Headache    HPI Jeremy Clay is a 38 y.o. male w/ a hx of tobacco abuse, testicular cancer s/p orchiectomy & chemo in remission who presents to the ED w/ complaints of intermittent headaches for the past 6 months.  Patient states that he has on average 1 headache per week sometimes more frequent and sometimes less.  He states the headaches are typically frontal or occipital, describes them as a dull pressure, typically have gradual onset with steady progression lasting several hours.  Variable severity.  At times associated with nausea, very rarely vomiting, and at times light sensitivity.  Typically takes Tylenol with improvement, but if they are really bad this does not alleviate his pain much.  He states that a family member wanted him to come get checked out on ER visit today.  He is completely asymptomatic at present.  Denies current headache, nausea, vomiting, or photophobia.  Denies numbness, weakness, dizziness, fever, chills, visual disturbance, or traumatic head injury.     HPI  Past Medical History:  Diagnosis Date  . Herniated disc   . 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   . 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[place]) 02/07/2014  . Testicular mass    right    Patient Active Problem List   Diagnosis Date Noted  . Fatigue 03/23/2014  . Weight loss 03/23/2014  . Altered taste 03/23/2014  . Nausea alone 03/23/2014  . Diarrhea 03/23/2014  . Mucositis (ulcerative) due to antineoplastic therapy 03/23/2014  . 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[place]) 02/07/2014  . ANKLE SPRAIN, LEFT 10/11/2008    Past Surgical History:  Procedure Laterality Date  . ORCHIECTOMY Right 02/07/2014   Procedure: RIGHT RADICAL ORCHIECTOMY;  Surgeon: Malka So, MD;  Location: [facility];  Service: Urology;  Laterality: Right;        Home Medications    Prior to Admission medications   Medication Sig Start Date End Date Taking? Authorizing Provider  acetaminophen (TYLENOL) 500 MG tablet Take 500 mg by mouth every 6 (six) hours as needed for moderate pain.   Yes [provider]  ibuprofen (ADVIL) 200 MG tablet Take 200 mg by mouth every 6 (six) hours as needed for moderate pain.   Yes [provider]  methadone (DOLOPHINE) 10 MG/ML solution Take 75 mg by mouth daily.   Yes [provider]  amoxicillin (AMOXIL) 500 MG capsule Take 1 capsule (500 mg total) by mouth 3 (three) times daily. Patient not taking: Reported on 10/08/2017 05/14/17   Lily Kocher, PA-C  ibuprofen (ADVIL,MOTRIN) 600 MG tablet Take 1 tablet (600 mg total) by mouth 4 (four) times daily. 05/14/17   Lily Kocher, PA-C  methadone (DOLOPHINE) 10 MG/ML solution Take 75 mg by mouth daily.    [provider]    Family History Family History  Problem Relation Age of Onset  . Diabetes Other   . Heart failure Other     Social History Social History   Tobacco Use  . Smoking status: Current Some Day Smoker    Packs/day: 0.25    Years: 13.00    Pack years: 3.25    Types: Cigarettes  . Smokeless tobacco: Never Used  Substance Use Topics  . Alcohol use: No  . Drug use:  No     Allergies   Tegaderm ag mesh [silver]   Review of Systems Review of Systems  Constitutional: Negative for chills and fever.  Eyes: Negative for visual disturbance.  Respiratory: Negative for shortness of breath.   Cardiovascular: Negative for chest pain.  Gastrointestinal: Positive for nausea (not at present) and vomiting (not at present). Negative for abdominal pain and diarrhea.  Neurological: Positive for headaches (not at present). Negative for dizziness, tremors, seizures, syncope, facial asymmetry, speech difficulty, weakness, light-headedness and numbness.  All  other systems reviewed and are negative.    Physical Exam Updated Vital Signs BP (!) 119/95 (BP Location: Right Arm)   Pulse 74   Temp 98.4 F (36.9 C) (Oral)   Resp 20   SpO2 100%   Physical Exam Vitals signs and nursing note reviewed.  Constitutional:      General: He is not in acute distress.    Appearance: He is well-developed. He is not toxic-appearing.  HENT:     Head: Normocephalic and atraumatic.     Right Ear: Tympanic membrane normal.     Left Ear: Tympanic membrane normal.     Nose: Nose normal.     Mouth/Throat:     Mouth: Mucous membranes are moist.     Comments: Uvula midline. Eyes:     General:        Right eye: No discharge.        Left eye: No discharge.     Extraocular Movements: Extraocular movements intact.     Conjunctiva/sclera: Conjunctivae normal.     Pupils: Pupils are equal, round, and reactive to light.     Comments: No proptosis.  Neck:     Musculoskeletal: Normal range of motion and neck supple. No neck rigidity.  Cardiovascular:     Rate and Rhythm: Normal rate and regular rhythm.  Pulmonary:     Effort: Pulmonary effort is normal. No respiratory distress.     Breath sounds: Normal breath sounds. No wheezing, rhonchi or rales.  Abdominal:     General: There is no distension.     Palpations: Abdomen is soft.     Tenderness: There is no abdominal tenderness.  Skin:    General: Skin is warm and dry.     Findings: No rash.  Neurological:     Mental Status: He is alert.     Comments: Alert. Clear speech. No facial droop. CNIII-XII grossly intact. Bilateral upper and lower extremities' sensation grossly intact. 5/5 symmetric strength with grip strength and with plantar and dorsi flexion bilaterally. Normal finger to nose bilaterally. Negative pronator drift.. Gait is steady and intact.   Psychiatric:        Behavior: Behavior normal.    ED Treatments / Results  Labs (all labs ordered are listed, but only abnormal results are displayed)  Labs Reviewed - No data to display  EKG None  Radiology Ct Head Wo Contrast  Result Date: 02/14/2019 CLINICAL DATA:  Headache, primarily frontal EXAM: CT HEAD WITHOUT CONTRAST TECHNIQUE: Contiguous axial images were obtained from the base of the skull through the vertex without intravenous contrast. COMPARISON:  None. FINDINGS: Brain: Ventricles are normal in size and configuration. There is no intracranial mass, hemorrhage, extra-axial fluid collection, or midline shift. Brain parenchyma appears unremarkable. No acute infarct. Vascular: No hyperdense vessel.  No evident vascular calcification. Skull: The bony calvarium appears intact. Sinuses/Orbits: Visualized paranasal sinuses are clear. Orbits appear symmetric bilaterally. Other: Mastoid air cells are clear. IMPRESSION: Study within  normal limits. Electronically Signed   By: Lowella Grip III M.D.   On: 02/14/2019 16:49    Procedures Procedures (including critical care time)  Medications Ordered in ED Medications - No data to display   Initial Impression / Assessment and Plan / ED Course  I have reviewed the triage vital signs and the nursing notes.  Pertinent labs & imaging results that were available during my care of the patient were reviewed by me and considered in my medical decision making (see chart for details).   Patient presents to the emergency department with intermittent headaches for the past 6 months.  Nontoxic-appearing, no apparent distress, vitals without significant abnormality, diastolic BP mildly elevated, doubt HTN emergency.  Patient currently asymptomatic in the emergency department.  His headaches have had gradual onset w/ steady progression. He has no focal neurologic deficits, nuchal rigidity, or fever.  CT scan was obtained prior to my assessment of the patient and was without acute abnormality.  No evidence of bleed, prior ischemic CVA, or mass.  Additionally do not suspect SAH, dural venous sinus  thrombosis, acute glaucoma, cell arteritis, or meningitis.  Possibly migraine versus tension headache, but overall unclear definitive etiology.  Will have patient establish with primary care and will also provide information for neurology follow-up.  Naproxen and Zofran given to use as needed in the interim.  I discussed treatment plan, need for PCP follow-up, and return precautions with the patient. Provided opportunity for questions, patient confirmed understanding and is in agreement with plan.    Final Clinical Impressions(s) / ED Diagnoses   Final diagnoses:  Nonintractable headache, unspecified chronicity pattern, unspecified headache type    ED Discharge Orders         Ordered    naproxen (NAPROSYN) 500 MG tablet  2 times daily     02/14/19 1746    ondansetron (ZOFRAN ODT) 4 MG disintegrating tablet  Every 8 hours PRN     02/14/19 1746           Kiera Hussey, Glynda Jaeger, PA-C 02/14/19 1747    Charlesetta Shanks, MD 02/15/19 1440

## 2019-02-14 NOTE — ED Notes (Signed)
Patient transported to CT 

## 2019-03-10 NOTE — Progress Notes (Signed)
No show

## 2019-03-14 ENCOUNTER — Other Ambulatory Visit: Payer: Self-pay

## 2019-03-14 ENCOUNTER — Telehealth (INDEPENDENT_AMBULATORY_CARE_PROVIDER_SITE_OTHER): Payer: Self-pay | Admitting: Neurology

## 2019-04-19 ENCOUNTER — Other Ambulatory Visit: Payer: Self-pay

## 2019-04-19 DIAGNOSIS — Z20822 Contact with and (suspected) exposure to covid-19: Secondary | ICD-10-CM

## 2019-04-20 LAB — NOVEL CORONAVIRUS, NAA: SARS-CoV-2, NAA: NOT DETECTED

## 2019-08-16 ENCOUNTER — Other Ambulatory Visit: Payer: Self-pay

## 2019-08-16 DIAGNOSIS — Z20822 Contact with and (suspected) exposure to covid-19: Secondary | ICD-10-CM

## 2019-08-18 ENCOUNTER — Telehealth: Payer: Self-pay | Admitting: *Deleted

## 2019-08-18 LAB — NOVEL CORONAVIRUS, NAA: SARS-CoV-2, NAA: NOT DETECTED

## 2019-08-18 NOTE — Telephone Encounter (Signed)
Called in requesting COVID-19 test result.   I let him know it wasn't ready yet.   He is active on MyChart so I let him know his result would show up there once it's processed. He had been tested before so was familiar with using his MyChart.    He thanked me for my help.

## 2019-09-14 ENCOUNTER — Other Ambulatory Visit: Payer: Self-pay

## 2019-09-14 ENCOUNTER — Other Ambulatory Visit: Payer: Self-pay | Admitting: Oncology

## 2019-09-14 DIAGNOSIS — C6291 Malignant neoplasm of right testis, unspecified whether descended or undescended: Secondary | ICD-10-CM

## 2019-09-30 ENCOUNTER — Inpatient Hospital Stay: Payer: Self-pay | Attending: Oncology

## 2019-09-30 ENCOUNTER — Ambulatory Visit (HOSPITAL_COMMUNITY): Admission: RE | Admit: 2019-09-30 | Payer: Self-pay | Source: Ambulatory Visit

## 2019-09-30 ENCOUNTER — Telehealth: Payer: Self-pay | Admitting: Oncology

## 2019-09-30 DIAGNOSIS — Z8547 Personal history of malignant neoplasm of testis: Secondary | ICD-10-CM | POA: Insufficient documentation

## 2019-09-30 DIAGNOSIS — Z9221 Personal history of antineoplastic chemotherapy: Secondary | ICD-10-CM | POA: Insufficient documentation

## 2019-09-30 DIAGNOSIS — Z9079 Acquired absence of other genital organ(s): Secondary | ICD-10-CM | POA: Insufficient documentation

## 2019-09-30 NOTE — Telephone Encounter (Signed)
Returned patient's phone call regarding rescheduling an appointment, left a voicemail. 

## 2019-10-04 ENCOUNTER — Other Ambulatory Visit: Payer: Self-pay

## 2019-10-04 ENCOUNTER — Ambulatory Visit (HOSPITAL_COMMUNITY)
Admission: RE | Admit: 2019-10-04 | Discharge: 2019-10-04 | Disposition: A | Discharge status: Home / Self Care | Payer: Self-pay | Source: Ambulatory Visit | Attending: Oncology | Admitting: Oncology

## 2019-10-04 ENCOUNTER — Inpatient Hospital Stay: Payer: Self-pay

## 2019-10-04 ENCOUNTER — Telehealth: Payer: Self-pay | Admitting: Oncology

## 2019-10-04 DIAGNOSIS — C6291 Malignant neoplasm of right testis, unspecified whether descended or undescended: Secondary | ICD-10-CM | POA: Insufficient documentation

## 2019-10-04 LAB — CMP (CANCER CENTER ONLY)
ALT: 48 U/L — ABNORMAL HIGH (ref 0–44)
AST: 32 U/L (ref 15–41)
Albumin: 4.2 g/dL (ref 3.5–5.0)
Alkaline Phosphatase: 69 U/L (ref 38–126)
Anion gap: 12 (ref 5–15)
BUN: 16 mg/dL (ref 6–20)
CO2: 27 mmol/L (ref 22–32)
Calcium: 9.2 mg/dL (ref 8.9–10.3)
Chloride: 99 mmol/L (ref 98–111)
Creatinine: 0.92 mg/dL (ref 0.61–1.24)
GFR, Est AFR Am: >60 mL/min (ref >60)
GFR, Estimated: >60 mL/min (ref >60)
Glucose, Bld: 85 mg/dL (ref 70–99)
Potassium: 4.1 mmol/L (ref 3.5–5.1)
Sodium: 138 mmol/L (ref 135–145)
Total Bilirubin: 0.2 mg/dL — ABNORMAL LOW (ref 0.3–1.2)
Total Protein: 8.7 g/dL — ABNORMAL HIGH (ref 6.5–8.1)

## 2019-10-04 LAB — CBC WITH DIFFERENTIAL (CANCER CENTER ONLY)
Abs Immature Granulocytes: 0.01 10*3/uL (ref 0.00–0.07)
Basophils Absolute: 0 10*3/uL (ref 0.0–0.1)
Basophils Relative: 1 %
Eosinophils Absolute: 0.1 10*3/uL (ref 0.0–0.5)
Eosinophils Relative: 2 %
HCT: 39.6 % (ref 39.0–52.0)
Hemoglobin: 12.7 g/dL — ABNORMAL LOW (ref 13.0–17.0)
Immature Granulocytes: 0 %
Lymphocytes Relative: 42 %
Lymphs Abs: 3 10*3/uL (ref 0.7–4.0)
MCH: 27.6 pg (ref 26.0–34.0)
MCHC: 32.1 g/dL (ref 30.0–36.0)
MCV: 86.1 fL (ref 80.0–100.0)
Monocytes Absolute: 0.5 10*3/uL (ref 0.1–1.0)
Monocytes Relative: 7 %
Neutro Abs: 3.3 10*3/uL (ref 1.7–7.7)
Neutrophils Relative %: 48 %
Platelet Count: 170 10*3/uL (ref 150–400)
RBC: 4.6 MIL/uL (ref 4.22–5.81)
RDW: 13.4 % (ref 11.5–15.5)
WBC Count: 7 10*3/uL (ref 4.0–10.5)
nRBC: 0 % (ref 0.0–0.2)

## 2019-10-04 LAB — LACTATE DEHYDROGENASE: LDH: 188 U/L (ref 98–192)

## 2019-10-04 MED ORDER — SODIUM CHLORIDE (PF) 0.9 % IJ SOLN
INTRAMUSCULAR | Status: AC
  Filled 2019-10-04: qty 50

## 2019-10-04 MED ORDER — IOHEXOL 300 MG/ML  SOLN
100.0000 mL | Freq: Once | INTRAMUSCULAR | Status: AC | PRN
  Administered 2019-10-04: 100 mL via INTRAVENOUS

## 2019-10-04 NOTE — Telephone Encounter (Signed)
Rescheduled per 1/25 sch msg, per pt's request. Called and spoke with pt, confirmed 1/26 appt

## 2019-10-05 LAB — BETA HCG QUANT (REF LAB): hCG Quant: <1 m[IU]/mL (ref 0–3)

## 2019-10-05 LAB — AFP TUMOR MARKER: AFP, Serum, Tumor Marker: 2.8 ng/mL (ref 0.0–8.3)

## 2019-10-07 ENCOUNTER — Inpatient Hospital Stay: Payer: Self-pay | Admitting: Oncology

## 2019-10-07 ENCOUNTER — Other Ambulatory Visit: Payer: Self-pay | Admitting: Oncology

## 2019-10-07 DIAGNOSIS — C6291 Malignant neoplasm of right testis, unspecified whether descended or undescended: Secondary | ICD-10-CM

## 2019-10-07 NOTE — Progress Notes (Signed)
Results of his labs and CT scan were reviewed and discussed with the patient today.  Continues to have regression of residual lymphadenopathy indicating no viable cancer at this time.  I recommended repeat CT scan and laboratory testing in 1 year.  This was discussed with the patient via phone and all his questions were answered.

## 2019-10-10 ENCOUNTER — Telehealth: Payer: Self-pay | Admitting: Oncology

## 2019-10-10 NOTE — Telephone Encounter (Signed)
Scheduled appt per 1/29 sch message - mailed reminder letter with appt date and time

## 2020-06-04 ENCOUNTER — Encounter (HOSPITAL_COMMUNITY): Payer: Self-pay | Admitting: *Deleted

## 2020-06-04 ENCOUNTER — Other Ambulatory Visit: Payer: Self-pay

## 2020-06-04 DIAGNOSIS — J029 Acute pharyngitis, unspecified: Secondary | ICD-10-CM | POA: Insufficient documentation

## 2020-06-04 DIAGNOSIS — Z041 Encounter for examination and observation following transport accident: Secondary | ICD-10-CM | POA: Diagnosis not present

## 2020-06-04 DIAGNOSIS — M436 Torticollis: Secondary | ICD-10-CM | POA: Insufficient documentation

## 2020-06-04 DIAGNOSIS — Y9389 Activity, other specified: Secondary | ICD-10-CM | POA: Insufficient documentation

## 2020-06-04 DIAGNOSIS — M25571 Pain in right ankle and joints of right foot: Secondary | ICD-10-CM | POA: Insufficient documentation

## 2020-06-04 DIAGNOSIS — Y9289 Other specified places as the place of occurrence of the external cause: Secondary | ICD-10-CM | POA: Diagnosis not present

## 2020-06-04 DIAGNOSIS — Z5321 Procedure and treatment not carried out due to patient leaving prior to being seen by health care provider: Secondary | ICD-10-CM | POA: Insufficient documentation

## 2020-06-04 DIAGNOSIS — M25521 Pain in right elbow: Secondary | ICD-10-CM | POA: Insufficient documentation
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
## 2020-06-05 ENCOUNTER — Encounter (HOSPITAL_COMMUNITY): Payer: Self-pay | Admitting: *Deleted

## 2020-06-05 ENCOUNTER — Other Ambulatory Visit: Payer: Self-pay

## 2020-06-05 ENCOUNTER — Emergency Department (HOSPITAL_COMMUNITY)
Admission: EM | Admit: 2020-06-05 | Discharge: 2020-06-05 | Disposition: A | Discharge status: Home / Self Care | Payer: No Typology Code available for payment source | Attending: Emergency Medicine | Admitting: Emergency Medicine

## 2020-06-05 ENCOUNTER — Emergency Department (HOSPITAL_COMMUNITY)
Admission: EM | Admit: 2020-06-05 | Discharge: 2020-06-05 | Disposition: A | Discharge status: Home / Self Care | Payer: No Typology Code available for payment source | Source: Home / Self Care | Attending: Emergency Medicine | Admitting: Emergency Medicine

## 2020-06-05 DIAGNOSIS — Z87891 Personal history of nicotine dependence: Secondary | ICD-10-CM | POA: Insufficient documentation

## 2020-06-05 DIAGNOSIS — S161XXA Strain of muscle, fascia and tendon at neck level, initial encounter: Secondary | ICD-10-CM | POA: Insufficient documentation

## 2020-06-05 DIAGNOSIS — Y92410 Unspecified street and highway as the place of occurrence of the external cause: Secondary | ICD-10-CM | POA: Insufficient documentation

## 2020-06-05 DIAGNOSIS — J029 Acute pharyngitis, unspecified: Secondary | ICD-10-CM | POA: Insufficient documentation

## 2020-06-05 MED ORDER — KETOROLAC TROMETHAMINE 30 MG/ML IJ SOLN
30.0000 mg | Freq: Once | INTRAMUSCULAR | Status: AC
  Administered 2020-06-05: 30 mg via INTRAMUSCULAR
  Filled 2020-06-05: qty 1

## 2020-06-05 MED ORDER — CYCLOBENZAPRINE HCL 10 MG PO TABS: 10.0000 mg | ORAL_TABLET | Freq: Three times a day (TID) | ORAL | 0 refills | Status: DC | PRN

## 2020-06-05 MED ORDER — CYCLOBENZAPRINE HCL 10 MG PO TABS
10.0000 mg | ORAL_TABLET | Freq: Once | ORAL | Status: AC
  Administered 2020-06-05: 10 mg via ORAL
  Filled 2020-06-05: qty 1
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
## 2020-09-19 ENCOUNTER — Emergency Department (HOSPITAL_COMMUNITY)
Admission: EM | Admit: 2020-09-19 | Discharge: 2020-09-19 | Disposition: A | Discharge status: Home / Self Care | Payer: 59 | Attending: Emergency Medicine | Admitting: Emergency Medicine

## 2020-09-19 ENCOUNTER — Other Ambulatory Visit: Payer: Self-pay

## 2020-09-19 ENCOUNTER — Encounter (HOSPITAL_COMMUNITY): Payer: Self-pay

## 2020-09-19 DIAGNOSIS — Z8579 Personal history of other malignant neoplasms of lymphoid, hematopoietic and related tissues: Secondary | ICD-10-CM | POA: Insufficient documentation

## 2020-09-19 DIAGNOSIS — T886XXA Anaphylactic reaction due to adverse effect of correct drug or medicament properly administered, initial encounter: Secondary | ICD-10-CM | POA: Insufficient documentation

## 2020-09-19 DIAGNOSIS — L299 Pruritus, unspecified: Secondary | ICD-10-CM | POA: Diagnosis present

## 2020-09-19 DIAGNOSIS — Z87891 Personal history of nicotine dependence: Secondary | ICD-10-CM | POA: Diagnosis not present

## 2020-09-19 DIAGNOSIS — Z8547 Personal history of malignant neoplasm of testis: Secondary | ICD-10-CM | POA: Insufficient documentation

## 2020-09-19 DIAGNOSIS — T782XXA Anaphylactic shock, unspecified, initial encounter: Secondary | ICD-10-CM

## 2020-09-19 MED ORDER — DIPHENHYDRAMINE HCL 50 MG/ML IJ SOLN
50.0000 mg | Freq: Once | INTRAMUSCULAR | Status: AC
  Administered 2020-09-19: 50 mg via INTRAVENOUS
  Filled 2020-09-19: qty 1

## 2020-09-19 MED ORDER — PREDNISONE 10 MG PO TABS: 20.0000 mg | ORAL_TABLET | Freq: Two times a day (BID) | ORAL | 0 refills | Status: AC

## 2020-09-19 MED ORDER — SODIUM CHLORIDE 0.9 % IV BOLUS
1000.0000 mL | Freq: Once | INTRAVENOUS | Status: AC
  Administered 2020-09-19: 1000 mL via INTRAVENOUS

## 2020-09-19 MED ORDER — EPINEPHRINE 0.3 MG/0.3ML IJ SOAJ: 0.3000 mg | INTRAMUSCULAR | 0 refills | Status: DC | PRN

## 2020-09-19 MED ORDER — EPINEPHRINE 0.3 MG/0.3ML IJ SOAJ
0.3000 mg | Freq: Once | INTRAMUSCULAR | Status: AC
  Administered 2020-09-19: 0.3 mg via INTRAMUSCULAR
  Filled 2020-09-19: qty 0.3

## 2020-09-19 MED ORDER — METHYLPREDNISOLONE SODIUM SUCC 125 MG IJ SOLR
125.0000 mg | Freq: Once | INTRAMUSCULAR | Status: AC
  Administered 2020-09-19: 125 mg via INTRAVENOUS
  Filled 2020-09-19: qty 2

## 2020-09-19 MED ORDER — LORATADINE 10 MG PO TABS: 10.0000 mg | ORAL_TABLET | Freq: Every day | ORAL | 0 refills | Status: DC

## 2020-09-19 MED ORDER — FAMOTIDINE IN NACL 20-0.9 MG/50ML-% IV SOLN
20.0000 mg | Freq: Once | INTRAVENOUS | Status: AC
  Administered 2020-09-19: 20 mg via INTRAVENOUS
  Filled 2020-09-19: qty 50

## 2020-09-19 NOTE — ED Provider Notes (Signed)
[facility] EMERGENCY DEPARTMENT Provider Note   CSN: [id number] Arrival date & time: 09/19/20  [id number]     History Chief Complaint  Patient presents with  . Allergic Reaction    Jeremy Clay is a 40 y.o. male.  Patient presents to ER chief complaint of facial itching and swelling and hoarseness to his voice.  He states that he was fine when he went to bed last night but he woke up around 3:30 in the morning feeling the symptoms.  He took an antihistamine does not recall the name of went back to sleep.  He woke up again at 6:00 with symptoms persisting and not improving.  He noticed swelling to his eyelids and into his lips and has persistent hoarseness of his voice.  Denies any shortness of breath at this time.  Denies any chest pain.  States he is not allergic to any foods or other material that he is knows of.  Denies any new soaps or lotions.  Denies fevers cough vomiting or diarrhea.        Past Medical History:  Diagnosis Date  . Herniated disc   . 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   . 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[place]) 02/07/2014  . Testicular mass    right    Patient Active Problem List   Diagnosis Date Noted  . Fatigue 03/23/2014  . Weight loss 03/23/2014  . Altered taste 03/23/2014  . Nausea alone 03/23/2014  . Diarrhea 03/23/2014  . Mucositis (ulcerative) due to antineoplastic therapy 03/23/2014  . 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[place]) 02/07/2014  . ANKLE SPRAIN, LEFT 10/11/2008    Past Surgical History:  Procedure Laterality Date  . ORCHIECTOMY Right 02/07/2014   Procedure: RIGHT RADICAL ORCHIECTOMY;  Surgeon: Malka So, MD;  [facility];  Service: Urology;  Laterality: Right;       Family History  Problem Relation Age of Onset  . Diabetes Other   . Heart failure Other     Social History   Tobacco Use  . Smoking status: Former Smoker    Packs/day: 0.25    Years: 13.00    Pack years:  3.25    Types: Cigarettes  . Smokeless tobacco: Never Used  Substance Use Topics  . Alcohol use: No  . Drug use: No    Home Medications Prior to Admission medications   Medication Sig Start Date End Date Taking? Authorizing Provider  EPINEPHrine 0.3 mg/0.3 mL IJ SOAJ injection Inject 0.3 mg into the muscle as needed for anaphylaxis. 09/19/20  Yes Luna Fuse, MD  loratadine (CLARITIN) 10 MG tablet Take 1 tablet (10 mg total) by mouth daily. 09/19/20  Yes Irving Lubbers, Greggory Brandy, MD  predniSONE (DELTASONE) 10 MG tablet Take 2 tablets (20 mg total) by mouth 2 (two) times daily with a meal for 5 days. 09/19/20 09/24/20 Yes Luna Fuse, MD  acetaminophen (TYLENOL) 500 MG tablet Take 500 mg by mouth every 6 (six) hours as needed for moderate pain.    [provider]  cyclobenzaprine (FLEXERIL) 10 MG tablet Take 1 tablet (10 mg total) by mouth 3 (three) times daily as needed for muscle spasms. 06/05/20   Montine Circle, PA-C  naproxen (NAPROSYN) 500 MG tablet Take 1 tablet (500 mg total) by mouth 2 (two) times daily. 02/14/19   Petrucelli, Samantha R, PA-C  ondansetron (ZOFRAN ODT) 4 MG disintegrating tablet Take 1 tablet (4 mg total) by mouth every  8 (eight) hours as needed for nausea or vomiting. 02/14/19   Petrucelli, Samantha R, PA-C    Allergies    Tegaderm ag mesh [silver]  Review of Systems   Review of Systems  Constitutional: Negative for fever.  HENT: Negative for ear pain and sore throat.   Eyes: Negative for pain.  Respiratory: Negative for cough.   Cardiovascular: Negative for chest pain.  Gastrointestinal: Negative for abdominal pain.  Genitourinary: Negative for flank pain.  Musculoskeletal: Negative for back pain.  Skin: Negative for color change and rash.  Neurological: Negative for syncope.  All other systems reviewed and are negative.   Physical Exam Updated Vital Signs BP 118/72 (BP Location: Left Arm)   Pulse 83   Temp 98.5 F (36.9 C) (Oral)   Resp 18   Ht  5\' 10"  (1.778 m)   Wt 83.9 kg   SpO2 100%   BMI 26.54 kg/m   Physical Exam Constitutional:      General: He is not in acute distress.    Appearance: He is well-developed.  HENT:     Head: Normocephalic.     Nose: Nose normal.     Mouth/Throat:     Comments: Upper and lower lips appear swollen.  Posterior pharynx still clear and able to be visualized.  No significant tongue swelling noted.  Sublingual region appears soft. Eyes:     Extraocular Movements: Extraocular movements intact.     Comments: Bilateral eyelids appear moderately edematous.  Cardiovascular:     Rate and Rhythm: Normal rate.  Pulmonary:     Effort: Pulmonary effort is normal.  Skin:    Coloration: Skin is not jaundiced.  Neurological:     Mental Status: He is alert. Mental status is at baseline.     ED Results / Procedures / Treatments   Labs (all labs ordered are listed, but only abnormal results are displayed) Labs Reviewed - No data to display  EKG None  Radiology No results found.  Procedures .Critical Care Performed by: Luna Fuse, MD Authorized by: Luna Fuse, MD   Critical care provider statement:    Critical care time (minutes):  40   Critical care time was exclusive of:  Separately billable procedures and treating other patients Comments:     Critical care provided for anaphylactic reaction.   (including critical care time)  Medications Ordered in ED Medications  diphenhydrAMINE (BENADRYL) injection 50 mg (50 mg Intravenous Given 09/19/20 0903)  methylPREDNISolone sodium succinate (SOLU-MEDROL) 125 mg/2 mL injection 125 mg (125 mg Intravenous Given 09/19/20 0902)  EPINEPHrine (EPI-PEN) injection 0.3 mg (0.3 mg Intramuscular Given 09/19/20 0905)  sodium chloride 0.9 % bolus 1,000 mL (0 mLs Intravenous Stopped 09/19/20 1128)  famotidine (PEPCID) IVPB 20 mg premix (0 mg Intravenous Stopped 09/19/20 1128)    ED Course  I have reviewed the triage vital signs and the nursing  notes.  Pertinent labs & imaging results that were available during my care of the patient were reviewed by me and considered in my medical decision making (see chart for details).    MDM Rules/Calculators/A&P                          I doubt Ludwig's angina given exam.  Concern for anaphylaxis.  Etiology unclear.  Patient given IV fluids IV Solu-Medrol IV Benadryl IV Pepcid and a fluid bolus.  Given epinephrine given as he has a hoarse voice now.  Patient is over  several hours with significant improvement.  He states his voice is no longer hoarse and swelling has nearly completely gone away.  Will recommend continued Rolaids and Benadryl at home.  Given prescription of EpiPen.  Advised immediate return for worsening symptoms.  Advise follow-up with his doctor otherwise within the next 2 to 4 days.   Final Clinical Impression(s) / ED Diagnoses Final diagnoses:  Anaphylaxis, initial encounter    Rx / DC Orders ED Discharge Orders         Ordered    predniSONE (DELTASONE) 10 MG tablet  2 times daily with meals        09/19/20 1201    loratadine (CLARITIN) 10 MG tablet  Daily        09/19/20 1201    EPINEPHrine 0.3 mg/0.3 mL IJ SOAJ injection  As needed        09/19/20 1201           Luna Fuse, MD 09/19/20 1201

## 2020-09-19 NOTE — Discharge Instructions (Addendum)
Call your primary care doctor or specialist as discussed in the next 2-3 days.   Return immediately back to the ER if:  Your symptoms worsen within the next 12-24 hours. You develop new symptoms such as new fevers, persistent vomiting, new pain, shortness of breath, or new weakness or numbness, or if you have any other concerns.  

## 2020-09-19 NOTE — ED Triage Notes (Signed)
Pt presents to ED with complaints of facial swelling and hoarseness since 0330. Pt unsure if allergic reaction to something. Pt denies trouble swallowing.

## 2020-09-20 ENCOUNTER — Other Ambulatory Visit: Payer: 59

## 2020-09-20 DIAGNOSIS — Z20822 Contact with and (suspected) exposure to covid-19: Secondary | ICD-10-CM

## 2020-09-23 LAB — NOVEL CORONAVIRUS, NAA: SARS-CoV-2, NAA: NOT DETECTED

## 2020-09-28 ENCOUNTER — Encounter (HOSPITAL_COMMUNITY): Payer: Self-pay

## 2020-09-28 ENCOUNTER — Inpatient Hospital Stay: Payer: 59 | Attending: Oncology

## 2020-09-28 ENCOUNTER — Ambulatory Visit (HOSPITAL_COMMUNITY)
Admission: RE | Admit: 2020-09-28 | Discharge: 2020-09-28 | Disposition: A | Discharge status: Home / Self Care | Payer: 59 | Source: Ambulatory Visit | Attending: Oncology | Admitting: Oncology

## 2020-09-28 ENCOUNTER — Other Ambulatory Visit: Payer: Self-pay

## 2020-09-28 DIAGNOSIS — Z9221 Personal history of antineoplastic chemotherapy: Secondary | ICD-10-CM | POA: Insufficient documentation

## 2020-09-28 DIAGNOSIS — C6291 Malignant neoplasm of right testis, unspecified whether descended or undescended: Secondary | ICD-10-CM

## 2020-09-28 DIAGNOSIS — Z9079 Acquired absence of other genital organ(s): Secondary | ICD-10-CM | POA: Insufficient documentation

## 2020-09-28 DIAGNOSIS — Z8547 Personal history of malignant neoplasm of testis: Secondary | ICD-10-CM | POA: Insufficient documentation

## 2020-09-28 LAB — CBC WITH DIFFERENTIAL (CANCER CENTER ONLY)
Abs Immature Granulocytes: 0.02 10*3/uL (ref 0.00–0.07)
Basophils Absolute: 0.1 10*3/uL (ref 0.0–0.1)
Basophils Relative: 1 %
Eosinophils Absolute: 0.1 10*3/uL (ref 0.0–0.5)
Eosinophils Relative: 2 %
HCT: 42.6 % (ref 39.0–52.0)
Hemoglobin: 13.9 g/dL (ref 13.0–17.0)
Immature Granulocytes: 0 %
Lymphocytes Relative: 51 %
Lymphs Abs: 3.1 10*3/uL (ref 0.7–4.0)
MCH: 28 pg (ref 26.0–34.0)
MCHC: 32.6 g/dL (ref 30.0–36.0)
MCV: 85.9 fL (ref 80.0–100.0)
Monocytes Absolute: 0.4 10*3/uL (ref 0.1–1.0)
Monocytes Relative: 6 %
Neutro Abs: 2.5 10*3/uL (ref 1.7–7.7)
Neutrophils Relative %: 40 %
Platelet Count: 190 10*3/uL (ref 150–400)
RBC: 4.96 MIL/uL (ref 4.22–5.81)
RDW: 12.2 % (ref 11.5–15.5)
WBC Count: 6.2 10*3/uL (ref 4.0–10.5)
nRBC: 0 % (ref 0.0–0.2)

## 2020-09-28 LAB — CMP (CANCER CENTER ONLY)
ALT: 30 U/L (ref 0–44)
AST: 24 U/L (ref 15–41)
Albumin: 4.2 g/dL (ref 3.5–5.0)
Alkaline Phosphatase: 72 U/L (ref 38–126)
Anion gap: 13 (ref 5–15)
BUN: 18 mg/dL (ref 6–20)
CO2: 23 mmol/L (ref 22–32)
Calcium: 9.3 mg/dL (ref 8.9–10.3)
Chloride: 102 mmol/L (ref 98–111)
Creatinine: 0.85 mg/dL (ref 0.61–1.24)
GFR, Estimated: >60 mL/min (ref >60)
Glucose, Bld: 105 mg/dL — ABNORMAL HIGH (ref 70–99)
Potassium: 4.3 mmol/L (ref 3.5–5.1)
Sodium: 138 mmol/L (ref 135–145)
Total Bilirubin: 0.3 mg/dL (ref 0.3–1.2)
Total Protein: 8.9 g/dL — ABNORMAL HIGH (ref 6.5–8.1)

## 2020-09-28 LAB — LACTATE DEHYDROGENASE: LDH: 185 U/L (ref 98–192)

## 2020-09-28 MED ORDER — IOHEXOL 300 MG/ML  SOLN
100.0000 mL | Freq: Once | INTRAMUSCULAR | Status: AC | PRN
  Administered 2020-09-28: 100 mL via INTRAVENOUS

## 2020-09-29 LAB — AFP TUMOR MARKER: AFP, Serum, Tumor Marker: 2.6 ng/mL (ref 0.0–8.3)

## 2020-09-29 LAB — BETA HCG QUANT (REF LAB): hCG Quant: <1 m[IU]/mL (ref 0–3)

## 2020-10-05 ENCOUNTER — Inpatient Hospital Stay (HOSPITAL_BASED_OUTPATIENT_CLINIC_OR_DEPARTMENT_OTHER): Payer: 59 | Admitting: Oncology

## 2020-10-05 ENCOUNTER — Other Ambulatory Visit: Payer: Self-pay

## 2020-10-05 VITALS — BP 125/87 | HR 79 | Temp 96.6°F | Resp 18 | Wt 179.7 lb

## 2020-10-05 DIAGNOSIS — Z8547 Personal history of malignant neoplasm of testis: Secondary | ICD-10-CM | POA: Diagnosis not present

## 2020-10-05 DIAGNOSIS — C6291 Malignant neoplasm of right testis, unspecified whether descended or undescended: Secondary | ICD-10-CM

## 2020-10-05 NOTE — Progress Notes (Signed)
Hematology and Oncology Follow Up Visit  Jeremy Clay [id number] 10-17-1980 40 y.o. 10/05/2020 10:10 AM   Principle Diagnosis: 40 year old man with right testicular cancer diagnosed in 2015.  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    Prior Therapy:  He is status post radical orchiectomy on 02/07/2014. The tumor measured 7.3 cm showed 10% embryonal, and 80% teratoma and 10% yolk sac tumor. He is S/P BEP chemotherapy for 3 cycles completed in September 2015.  Current therapy: Active surveillance  Interim History:  Jeremy Clay is here for a follow-up visit.  Since last visit, he reports no major changes in his health.  He continues to work full-time without any decline in ability to do so.  He denies any abdominal pain or discomfort.  He denies any hematuria or dysuria.  He has established care with a primary care physician and up-to-date on health maintenance issues.     Medications: Updated on review. Current Outpatient Medications  Medication Sig Dispense Refill  . acetaminophen (TYLENOL) 500 MG tablet Take 500 mg by mouth every 6 (six) hours as needed for moderate pain.    . cyclobenzaprine (FLEXERIL) 10 MG tablet Take 1 tablet (10 mg total) by mouth 3 (three) times daily as needed for muscle spasms. 10 tablet 0  . EPINEPHrine 0.3 mg/0.3 mL IJ SOAJ injection Inject 0.3 mg into the muscle as needed for anaphylaxis. 1 each 0  . loratadine (CLARITIN) 10 MG tablet Take 1 tablet (10 mg total) by mouth daily. 5 tablet 0  . naproxen (NAPROSYN) 500 MG tablet Take 1 tablet (500 mg total) by mouth 2 (two) times daily. 10 tablet 0  . ondansetron (ZOFRAN ODT) 4 MG disintegrating tablet Take 1 tablet (4 mg total) by mouth every 8 (eight) hours as needed for nausea or vomiting. 5 tablet 0   No current facility-administered medications for this visit.     Allergies:  Allergies  Allergen Reactions  . Tegaderm Ag Mesh [Silver] Rash     Redness & irritation noted from IV site dressing 24 hrs after d/c'd        Physical Exam:   Blood pressure 125/87, pulse 79, temperature (!) 96.6 F (35.9 C), temperature source Tympanic, resp. rate 18, weight 179 lb 11.2 oz (81.5 kg), SpO2 100 %.   ECOG: 0    General appearance: Comfortable appearing without any discomfort Head: Normocephalic without any trauma Oropharynx: Mucous membranes are moist and pink without any thrush or ulcers. Eyes: Pupils are equal and round reactive to light. Lymph nodes: No cervical, supraclavicular, inguinal or axillary lymphadenopathy.   Heart:regular rate and rhythm.  S1 and S2 without leg edema. Lung: Clear without any rhonchi or wheezes.  No dullness to percussion. Abdomin: Soft, nontender, nondistended with good bowel sounds.  No hepatosplenomegaly. Musculoskeletal: No joint deformity or effusion.  Full range of motion noted. Neurological: No deficits noted on motor, sensory and deep tendon reflex exam. Skin: No petechial rash or dryness.  Appeared moist.      Lab Results: Lab Results  Component Value Date   WBC 6.2 09/28/2020   HGB 13.9 09/28/2020   HCT 42.6 09/28/2020   MCV 85.9 09/28/2020   PLT 190 09/28/2020     Chemistry      Component Value Date/Time   NA 138 09/28/2020 1003   NA 138 03/06/2016 1611   K 4.3 09/28/2020 1003   K 3.9 03/06/2016 1611   CL 102 09/28/2020 1003  CO2 23 09/28/2020 1003   CO2 24 03/06/2016 1611   BUN 18 09/28/2020 1003   BUN 12.3 03/06/2016 1611   CREATININE 0.85 09/28/2020 1003   CREATININE 0.9 03/06/2016 1611      Component Value Date/Time   CALCIUM 9.3 09/28/2020 1003   CALCIUM 9.3 03/06/2016 1611   ALKPHOS 72 09/28/2020 1003   ALKPHOS 77 03/06/2016 1611   AST 24 09/28/2020 1003   AST 23 03/06/2016 1611   ALT 30 09/28/2020 1003   ALT 29 03/06/2016 1611   BILITOT 0.3 09/28/2020 1003   BILITOT <0.30 03/06/2016 1611        IMPRESSION: Continued further decrease in size of  the portacaval lymph node. No new or progressive findings in the abdomen or pelvis.    Impression and Plan:  40 year old man with:  1.  Stage II right testicular cancer diagnosed in 2015.  He was found to have nonseminomatous with 10% embryonal, 10% yolk sac and 80% teratoma.    The natural course of this disease was reviewed at this time.  Treatment options were discussed.  CT scan obtained on September 28, 2020 was personally reviewed and showed no evidence of progressive disease.  He has some residual regressing aortocaval node measuring 1.8 x 1.4 cm previously was 2.0 x 1.7 cm.  This is likely represents treated malignancy and possibly residual teratoma.  Treatment options including surgical resection versus continued observation were reviewed at this time.  Given the longstanding nature of this malignancy recommended continued observation for the time being.  Laboratory data including tumor markers reviewed and showed no evidence of any abnormalities.  2.  Survivorship issues: Activity time discussed importance of healthy start living and age-appropriate screening.  He has established care with a primary care physician.  3. Followup: He will return in 1 year for a follow-up.   30  minutes were dedicated to this visit. The time was spent on reviewing laboratory data, imaging studies, discussing treatment options, discussing differential diagnosis and answering questions regarding future plan.    Zola Button, MD 1/28/202210:10 AM

## 2020-10-30 ENCOUNTER — Other Ambulatory Visit: Payer: Self-pay

## 2020-10-30 ENCOUNTER — Encounter: Payer: Self-pay | Admitting: Emergency Medicine

## 2020-10-30 ENCOUNTER — Ambulatory Visit
Admission: EM | Admit: 2020-10-30 | Discharge: 2020-10-30 | Disposition: A | Discharge status: Home / Self Care | Payer: 59 | Attending: Emergency Medicine | Admitting: Emergency Medicine

## 2020-10-30 DIAGNOSIS — K0889 Other specified disorders of teeth and supporting structures: Secondary | ICD-10-CM

## 2020-10-30 DIAGNOSIS — K047 Periapical abscess without sinus: Secondary | ICD-10-CM | POA: Diagnosis not present

## 2020-10-30 MED ORDER — AMOXICILLIN-POT CLAVULANATE 875-125 MG PO TABS: 1.0000 | ORAL_TABLET | Freq: Two times a day (BID) | ORAL | 0 refills | Status: DC

## 2020-10-30 MED ORDER — CHLORHEXIDINE GLUCONATE 0.12 % MT SOLN: 15.0000 mL | Freq: Two times a day (BID) | OROMUCOSAL | 0 refills | Status: DC

## 2020-10-30 MED ORDER — NAPROXEN 500 MG PO TABS: 500.0000 mg | ORAL_TABLET | Freq: Two times a day (BID) | ORAL | 0 refills | Status: DC

## 2020-10-30 NOTE — ED Provider Notes (Signed)
.  Jeremy Clay   [id number] 10/30/20 Arrival Time: 1522  CC: DENTAL PAIN and ABSCESS  SUBJECTIVE:  Jeremy Clay is a 40 y.o. male presented to the urgent care for complaint of dental pain and abscess for the past 4 days.  Denies a precipitating event or trauma.  Localizes pain to frontal  lower gum.  Has tried OTC analgesics without relief.  Worse with chewing.  Does not see a dentist regularly.  Report similar symptoms in the past.  Denies fever, chills, dysphagia, odynophagia, oral or neck swelling, nausea, vomiting, chest pain, SOB.    ROS: As per HPI.  All other pertinent ROS negative.     Past Medical History:  Diagnosis Date  . Herniated disc   . 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   . 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[place]) 02/07/2014  . Testicular mass    right   Past Surgical History:  Procedure Laterality Date  . ORCHIECTOMY Right 02/07/2014   Procedure: RIGHT RADICAL ORCHIECTOMY;  Surgeon: Malka So, MD;  [facility];  Service: Urology;  Laterality: Right;   Allergies  Allergen Reactions  . Tegaderm Ag Mesh [Silver] Rash    Redness & irritation noted from IV site dressing 24 hrs after d/c'd   . Alpha-Gal Swelling   No current facility-administered medications on file prior to encounter.   Current Outpatient Medications on File Prior to Encounter  Medication Sig Dispense Refill  . acetaminophen (TYLENOL) 500 MG tablet Take 500 mg by mouth every 6 (six) hours as needed for moderate pain.    . cyclobenzaprine (FLEXERIL) 10 MG tablet Take 1 tablet (10 mg total) by mouth 3 (three) times daily as needed for muscle spasms. (Patient not taking: Reported on 10/05/2020) 10 tablet 0  . EPINEPHrine 0.3 mg/0.3 mL IJ SOAJ injection Inject 0.3 mg into the muscle as needed for anaphylaxis. (Patient not taking: Reported on 10/05/2020) 1 each 0  . loratadine (CLARITIN) 10 MG tablet Take 1 tablet (10 mg total) by  mouth daily. 5 tablet 0  . ondansetron (ZOFRAN ODT) 4 MG disintegrating tablet Take 1 tablet (4 mg total) by mouth every 8 (eight) hours as needed for nausea or vomiting. 5 tablet 0   Social History   Socioeconomic History  . Marital status: Single    Spouse name: Not on file  . Number of children: Not on file  . Years of education: Not on file  . Highest education level: Not on file  Occupational History  . Not on file  Tobacco Use  . Smoking status: Former Smoker    Packs/day: 0.25    Years: 13.00    Pack years: 3.25    Types: Cigarettes  . Smokeless tobacco: Never Used  Substance and Sexual Activity  . Alcohol use: No  . Drug use: No  . Sexual activity: Not on file  Other Topics Concern  . Not on file  Social History Narrative  . Not on file   Social Determinants of Health   Financial Resource Strain: Not on file  Food Insecurity: Not on file  Transportation Needs: Not on file  Physical Activity: Not on file  Stress: Not on file  Social Connections: Not on file  Intimate Partner Violence: Not on file   Family History  Problem Relation Age of Onset  . Diabetes Other   . Heart failure Other     OBJECTIVE:  Physical Exam Vitals  and nursing note reviewed.  Constitutional:      General: He is not in acute distress.    Appearance: Normal appearance. He is normal weight. He is not ill-appearing, toxic-appearing or diaphoretic.  HENT:     Mouth/Throat:     Lips: Pink.     Mouth: Mucous membranes are moist.     Dentition: Abnormal dentition. Dental caries and dental abscesses present.     Pharynx: Oropharynx is clear.     Tonsils: No tonsillar exudate or tonsillar abscesses. 1+ on the right. 1+ on the left.  Cardiovascular:     Rate and Rhythm: Normal rate and regular rhythm.     Pulses: Normal pulses.     Heart sounds: Normal heart sounds. No murmur heard. No friction rub. No gallop.   Pulmonary:     Effort: Pulmonary effort is normal. No respiratory  distress.     Breath sounds: Normal breath sounds. No stridor. No wheezing, rhonchi or rales.  Chest:     Chest wall: No tenderness.  Neurological:     Mental Status: He is alert and oriented to person, place, and time.     ASSESSMENT & PLAN:  1. Dental abscess   2. Pain, dental     Meds ordered this encounter  Medications  . chlorhexidine (PERIDEX) 0.12 % solution    Sig: Use as directed 15 mLs in the mouth or throat 2 (two) times daily.    Dispense:  120 mL    Refill:  0  . amoxicillin-clavulanate (AUGMENTIN) 875-125 MG tablet    Sig: Take 1 tablet by mouth every 12 (twelve) hours.    Dispense:  14 tablet    Refill:  0  . naproxen (NAPROSYN) 500 MG tablet    Sig: Take 1 tablet (500 mg total) by mouth 2 (two) times daily.    Dispense:  30 tablet    Refill:  0   Discharge instructions  Naproxen prescribed.  Use as directed for pain relief Chlorhexidine mouthwash and Augmentin were prescribed Recommend soft diet until evaluated by dentist Maintain oral hygiene care Follow up with dentist as soon as possible for further evaluation and treatment  Return or go to the ED if you have any new or worsening symptoms such as fever, chills, difficulty swallowing, painful swallowing, oral or neck swelling, nausea, vomiting, chest pain, SOB, etc...  Reviewed expectations re: course of current medical issues. Questions answered. Outlined signs and symptoms indicating need for more acute intervention. Patient verbalized understanding. After Visit Summary given.   Emerson Monte, [place] 10/30/20 [phone]

## 2020-10-30 NOTE — Discharge Instructions (Addendum)
Naproxen prescribed.  Use as directed for pain relief Chlorhexidine mouthwash and Augmentin were prescribed Recommend soft diet until evaluated by dentist Maintain oral hygiene care Follow up with dentist as soon as possible for further evaluation and treatment  Return or go to the ED if you have any new or worsening symptoms such as fever, chills, difficulty swallowing, painful swallowing, oral or neck swelling, nausea, vomiting, chest pain, SOB, etc..[name]

## 2020-10-30 NOTE — ED Triage Notes (Signed)
Abscess to bottom front of mouth x 4 days

## 2021-09-23 ENCOUNTER — Telehealth: Payer: Self-pay | Admitting: Oncology

## 2021-09-23 NOTE — Telephone Encounter (Signed)
Tried calling patient to complete the 1/16 scheduling message. Unable to talk with patient or leave a voicemail. Phone rang and rang without being sent to a voicemail. Called the patient twice. Patient will be mailed updated calendar.

## 2021-09-26 ENCOUNTER — Telehealth: Payer: Self-pay | Admitting: *Deleted

## 2021-09-26 NOTE — Telephone Encounter (Signed)
PC to patient, informed him of Dr. Shadad's message below.  Patient verbalizes understanding. °

## 2021-09-26 NOTE — Telephone Encounter (Signed)
-----   Message from Wyatt Portela, MD sent at 09/26/2021  9:19 AM EST ----- No scan needed. Will discuss with visit. Thanks ----- Message ----- From: Rolene Course, RN Sent: 09/26/2021   8:58 AM EST To: Wyatt Portela, MD  This patient called, has an appointment next week & is asking if he needs a CT scan.  There isn't one ordered.  Please advise.  Thanks, Bethena Roys

## 2021-10-04 ENCOUNTER — Inpatient Hospital Stay: Payer: 59 | Attending: Oncology

## 2021-10-04 ENCOUNTER — Inpatient Hospital Stay (HOSPITAL_BASED_OUTPATIENT_CLINIC_OR_DEPARTMENT_OTHER): Payer: 59 | Admitting: Oncology

## 2021-10-04 ENCOUNTER — Other Ambulatory Visit: Payer: Self-pay

## 2021-10-04 VITALS — BP 142/98 | HR 63 | Temp 97.7°F | Resp 18 | Ht 70.0 in | Wt 180.0 lb

## 2021-10-04 DIAGNOSIS — C6291 Malignant neoplasm of right testis, unspecified whether descended or undescended: Secondary | ICD-10-CM

## 2021-10-04 DIAGNOSIS — Z8547 Personal history of malignant neoplasm of testis: Secondary | ICD-10-CM | POA: Insufficient documentation

## 2021-10-04 LAB — CBC WITH DIFFERENTIAL (CANCER CENTER ONLY)
Abs Immature Granulocytes: 0.01 10*3/uL (ref 0.00–0.07)
Basophils Absolute: 0.1 10*3/uL (ref 0.0–0.1)
Basophils Relative: 1 %
Eosinophils Absolute: 0.1 10*3/uL (ref 0.0–0.5)
Eosinophils Relative: 2 %
HCT: 41.2 % (ref 39.0–52.0)
Hemoglobin: 13.7 g/dL (ref 13.0–17.0)
Immature Granulocytes: 0 %
Lymphocytes Relative: 39 %
Lymphs Abs: 2.4 10*3/uL (ref 0.7–4.0)
MCH: 29.4 pg (ref 26.0–34.0)
MCHC: 33.3 g/dL (ref 30.0–36.0)
MCV: 88.4 fL (ref 80.0–100.0)
Monocytes Absolute: 0.5 10*3/uL (ref 0.1–1.0)
Monocytes Relative: 8 %
Neutro Abs: 3 10*3/uL (ref 1.7–7.7)
Neutrophils Relative %: 50 %
Platelet Count: 160 10*3/uL (ref 150–400)
RBC: 4.66 MIL/uL (ref 4.22–5.81)
RDW: 12.6 % (ref 11.5–15.5)
WBC Count: 6.1 10*3/uL (ref 4.0–10.5)
nRBC: 0 % (ref 0.0–0.2)

## 2021-10-04 LAB — CMP (CANCER CENTER ONLY)
ALT: 38 U/L (ref 0–44)
AST: 28 U/L (ref 15–41)
Albumin: 4.5 g/dL (ref 3.5–5.0)
Alkaline Phosphatase: 59 U/L (ref 38–126)
Anion gap: 7 (ref 5–15)
BUN: 20 mg/dL (ref 6–20)
CO2: 26 mmol/L (ref 22–32)
Calcium: 9.8 mg/dL (ref 8.9–10.3)
Chloride: 104 mmol/L (ref 98–111)
Creatinine: 0.88 mg/dL (ref 0.61–1.24)
GFR, Estimated: >60 mL/min (ref >60)
Glucose, Bld: 82 mg/dL (ref 70–99)
Potassium: 4 mmol/L (ref 3.5–5.1)
Sodium: 137 mmol/L (ref 135–145)
Total Bilirubin: 0.4 mg/dL (ref 0.3–1.2)
Total Protein: 8.5 g/dL — ABNORMAL HIGH (ref 6.5–8.1)

## 2021-10-04 LAB — LACTATE DEHYDROGENASE: LDH: 172 U/L (ref 98–192)

## 2021-10-04 NOTE — Progress Notes (Signed)
Hematology and Oncology Follow Up Visit  Jeremy Clay [id number] [birth year] 41 y.o. 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   Prior Therapy:  He is status post radical orchiectomy on 02/07/2014. The tumor measured 7.3 cm showed 10% embryonal, and 80% teratoma and 10% yolk sac tumor. He is S/P BEP chemotherapy for 3 cycles completed in September 2015.  Current therapy: Active surveillance  Interim History:  Mr. Jeremy Clay presents today for repeat evaluation.  Since the last visit, he reports no major changes in his health.  He denies any recent hospitalizations or illnesses.  He denies any bone pain or pathological fractures.  He denies any weight loss or appetite changes.     Medications: Reviewed without changes. Current Outpatient Medications  Medication Sig Dispense Refill   acetaminophen (TYLENOL) 500 MG tablet Take 500 mg by mouth every 6 (six) hours as needed for moderate pain.     amoxicillin-clavulanate (AUGMENTIN) 875-125 MG tablet Take 1 tablet by mouth every 12 (twelve) hours. 14 tablet 0   chlorhexidine (PERIDEX) 0.12 % solution Use as directed 15 mLs in the mouth or throat 2 (two) times daily. 120 mL 0   cyclobenzaprine (FLEXERIL) 10 MG tablet Take 1 tablet (10 mg total) by mouth 3 (three) times daily as needed for muscle spasms. (Patient not taking: Reported on 10/05/2020) 10 tablet 0   EPINEPHrine 0.3 mg/0.3 mL IJ SOAJ injection Inject 0.3 mg into the muscle as needed for anaphylaxis. (Patient not taking: Reported on 10/05/2020) 1 each 0   loratadine (CLARITIN) 10 MG tablet Take 1 tablet (10 mg total) by mouth daily. 5 tablet 0   naproxen (NAPROSYN) 500 MG tablet Take 1 tablet (500 mg total) by mouth 2 (two) times daily. 30 tablet 0   ondansetron (ZOFRAN ODT) 4 MG disintegrating tablet Take 1 tablet (4 mg total) by mouth every 8 (eight) hours as needed for  nausea or vomiting. 5 tablet 0   No current facility-administered medications for this visit.     Allergies:  Allergies  Allergen Reactions   Tegaderm Ag Mesh [Silver] Rash    Redness & irritation noted from IV site dressing 24 hrs after d/c'd    Alpha-Gal Swelling       Physical Exam:  Blood pressure (!) 142/98, pulse 63, temperature 97.7 F (36.5 C), temperature source Temporal, resp. rate 18, height 5\' 10"  (1.778 m), weight 180 lb (81.6 kg), SpO2 100 %.     ECOG: 0   General appearance: Alert, awake without any distress. Head: Atraumatic without abnormalities Oropharynx: Without any thrush or ulcers. Eyes: No scleral icterus. Lymph nodes: No lymphadenopathy noted in the cervical, supraclavicular, or axillary nodes Heart:regular rate and rhythm, without any murmurs or gallops.   Lung: Clear to auscultation without any rhonchi, wheezes or dullness to percussion. Abdomin: Soft, nontender without any shifting dullness or ascites. Musculoskeletal: No clubbing or cyanosis. Neurological: No motor or sensory deficits. Skin: No rashes or lesions.       Lab Results: Lab Results  Component Value Date   WBC 6.2 09/28/2020   HGB 13.9 09/28/2020   HCT 42.6 09/28/2020   MCV 85.9 09/28/2020   PLT 190 09/28/2020     Chemistry      Component Value Date/Time   NA 138 09/28/2020 1003   NA 138 03/06/2016 1611   K 4.3 09/28/2020 1003   K 3.9 03/06/2016 1611   CL 102  09/28/2020 1003   CO2 23 09/28/2020 1003   CO2 24 03/06/2016 1611   BUN 18 09/28/2020 1003   BUN 12.3 03/06/2016 1611   CREATININE 0.85 09/28/2020 1003   CREATININE 0.9 03/06/2016 1611      Component Value Date/Time   CALCIUM 9.3 09/28/2020 1003   CALCIUM 9.3 03/06/2016 1611   ALKPHOS 72 09/28/2020 1003   ALKPHOS 77 03/06/2016 1611   AST 24 09/28/2020 1003   AST 23 03/06/2016 1611   ALT 30 09/28/2020 1003   ALT 29 03/06/2016 1611   BILITOT 0.3 09/28/2020 1003   BILITOT <0.30 03/06/2016 1611              Impression and Plan:  41 year old man with:  1.  Testicular cancer diagnosed in 2015.  He was found to have stage II nonseminomatous tumor with 10% embryonal, 10% yolk sac and 80% teratoma components.  He is currently on active surveillance at this time without any evidence of relapsed disease.  CT scan obtained in 2022 which showed no evidence of any residual tumor at this time.  I recommended continued annual surveillance at this time with repeat imaging studies only if he has abnormalities on his exam or tumor markers.  He is approaching 8 years out from his diagnosis and treatment.  He is agreeable with this plan.  2.  Survivorship issues: He is up-to-date at this time with age-appropriate health maintenance.  3. Followup: In 1 year for repeat follow-up.   20  minutes were spent on this encounter.  The time was dedicated to reviewing laboratory data, disease status update and outlining future plan of care discussion.    Zola Button, MD 1/27/202310:50 AM

## 2021-10-06 LAB — BETA HCG QUANT (REF LAB): hCG Quant: <1 m[IU]/mL (ref 0–3)

## 2021-10-06 LAB — AFP TUMOR MARKER: AFP, Serum, Tumor Marker: 3.1 ng/mL (ref 0.0–6.9)

## 2022-09-02 ENCOUNTER — Telehealth: Payer: Self-pay | Admitting: Hematology and Oncology

## 2022-09-02 ENCOUNTER — Telehealth: Payer: Self-pay | Admitting: Oncology

## 2022-09-02 NOTE — Telephone Encounter (Signed)
Patient called back to confirm appointments. Patient notified per Dr. Alen Blew transition.

## 2022-10-03 ENCOUNTER — Ambulatory Visit: Payer: 59 | Admitting: Oncology

## 2022-10-03 ENCOUNTER — Inpatient Hospital Stay: Payer: Self-pay

## 2022-10-03 ENCOUNTER — Other Ambulatory Visit: Payer: Self-pay

## 2022-10-03 ENCOUNTER — Inpatient Hospital Stay: Payer: Self-pay | Attending: Hematology and Oncology | Admitting: Hematology and Oncology

## 2022-10-03 ENCOUNTER — Other Ambulatory Visit: Payer: 59

## 2022-10-03 VITALS — BP 137/111 | HR 79 | Temp 98.8°F | Resp 18 | Ht 70.0 in | Wt 177.2 lb

## 2022-10-03 DIAGNOSIS — C6291 Malignant neoplasm of right testis, unspecified whether descended or undescended: Secondary | ICD-10-CM | POA: Insufficient documentation

## 2022-10-03 DIAGNOSIS — R030 Elevated blood-pressure reading, without diagnosis of hypertension: Secondary | ICD-10-CM | POA: Insufficient documentation

## 2022-10-03 DIAGNOSIS — Z87891 Personal history of nicotine dependence: Secondary | ICD-10-CM | POA: Insufficient documentation

## 2022-10-03 LAB — CBC WITH DIFFERENTIAL (CANCER CENTER ONLY)
Abs Immature Granulocytes: 0.02 10*3/uL (ref 0.00–0.07)
Basophils Absolute: 0 10*3/uL (ref 0.0–0.1)
Basophils Relative: 1 %
Eosinophils Absolute: 0 10*3/uL (ref 0.0–0.5)
Eosinophils Relative: 1 %
HCT: 43 % (ref 39.0–52.0)
Hemoglobin: 14.7 g/dL (ref 13.0–17.0)
Immature Granulocytes: 0 %
Lymphocytes Relative: 23 %
Lymphs Abs: 1.6 10*3/uL (ref 0.7–4.0)
MCH: 29.8 pg (ref 26.0–34.0)
MCHC: 34.2 g/dL (ref 30.0–36.0)
MCV: 87.2 fL (ref 80.0–100.0)
Monocytes Absolute: 0.5 10*3/uL (ref 0.1–1.0)
Monocytes Relative: 7 %
Neutro Abs: 4.9 10*3/uL (ref 1.7–7.7)
Neutrophils Relative %: 68 %
Platelet Count: 180 10*3/uL (ref 150–400)
RBC: 4.93 MIL/uL (ref 4.22–5.81)
RDW: 12.5 % (ref 11.5–15.5)
WBC Count: 7 10*3/uL (ref 4.0–10.5)
nRBC: 0 % (ref 0.0–0.2)

## 2022-10-03 LAB — CMP (CANCER CENTER ONLY)
ALT: 29 U/L (ref 0–44)
AST: 26 U/L (ref 15–41)
Albumin: 4.3 g/dL (ref 3.5–5.0)
Alkaline Phosphatase: 64 U/L (ref 38–126)
Anion gap: 8 (ref 5–15)
BUN: 15 mg/dL (ref 6–20)
CO2: 28 mmol/L (ref 22–32)
Calcium: 9.8 mg/dL (ref 8.9–10.3)
Chloride: 101 mmol/L (ref 98–111)
Creatinine: 0.89 mg/dL (ref 0.61–1.24)
GFR, Estimated: >60 mL/min (ref >60)
Glucose, Bld: 98 mg/dL (ref 70–99)
Potassium: 3.9 mmol/L (ref 3.5–5.1)
Sodium: 137 mmol/L (ref 135–145)
Total Bilirubin: 0.4 mg/dL (ref 0.3–1.2)
Total Protein: 8.6 g/dL — ABNORMAL HIGH (ref 6.5–8.1)

## 2022-10-03 LAB — LACTATE DEHYDROGENASE: LDH: 158 U/L (ref 98–192)

## 2022-10-04 ENCOUNTER — Encounter: Payer: Self-pay | Admitting: Hematology and Oncology

## 2022-10-04 DIAGNOSIS — R030 Elevated blood-pressure reading, without diagnosis of hypertension: Secondary | ICD-10-CM | POA: Insufficient documentation

## 2022-10-04 NOTE — Assessment & Plan Note (Signed)
His blood pressure is intermittently elevated but he is not symptomatic I wonder it could be due to whitecoat hypertension I recommend the patient to start checking his blood pressure at home and to seek attention from primary care doctor if indeed he has new onset of hypertension

## 2022-10-04 NOTE — Progress Notes (Signed)
[facility] FOLLOW-UP progress notes  Patient Care Team: Chelsea, Minto as PCP - General (Family Medicine)  CHIEF COMPLAINTS/PURPOSE OF VISIT:  History of testicular cancer, for further evaluation  HISTORY OF PRESENTING ILLNESS:  Jeremy Clay 42 y.o. male was transferred to my care after his prior physician has left.  I have reviewed his chart extensively  I reviewed the patient's records extensive and collaborated the history with the patient. 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 - MARGINS NOT INVOLVED. - SEE ONCOLOGY TABLE AND COMMENT. Microscopic Comment ONCOLOGY TABLE - TESTIS 1. Specimen and laterality: Right testis. 2. Tumor focality: Unifocal 3. Macroscopic extent of tumor: Confined within tunica albuginea 4. Maximum tumor size (cm): 7.3 cm. 5. 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 6. Microscopic tumor extension: Confined within tunica albuginea 7. Spermatic cord and surgical margins: Free of tumor. 8. Lymph-Vascular invasion: Not identified. 9. Intratubular germ cell neoplasia: Present, focal. 10. Lymph nodes: # examined: - 0; # positive: - N/A 11. TNM code: pT1, pNX 12. Serum tumor markers: See patient's medical record 13. Comment: The tumor is 7.3 cm in greatest dimension and is confined to within the tunica albuginea and occupies most of the testicle. 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 There are a few microscopic foci with immature stroma which may represent a focal, microscopic immature component. There is also focal yolk sac tumor and embryonal carcinoma. The margins of the specimen are not involved by tumor. Immunohistochemistry is performed and  supports the diagnosis.   03/13/2014 - 05/09/2014 Chemotherapy   He received 3 cycles of adjuvant treatment with bleomycin, etoposide and cisplatin   09/28/2020 Imaging   Continued further decrease in size of the portacaval lymph node. No new or progressive findings in the abdomen or pelvis.   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[place]) 02/07/2014   Testicular mass    right    SURGICAL HISTORY: Past Surgical History:  Procedure Laterality Date   ORCHIECTOMY Right 02/07/2014   Procedure: RIGHT RADICAL ORCHIECTOMY;  Surgeon: Malka So, MD;  Location: [facility];  Service: Urology;  Laterality: Right;    SOCIAL HISTORY: Social History   Socioeconomic History   Marital status: Single    Spouse name: Not on file   Number of children: 1   Years of education: Not on file   Highest education level: Not on file  Occupational History   Not on file  Tobacco Use   Smoking status: Former    Packs/day: 0.25    Years: 13.00    Total pack years: 3.25    Types: Cigarettes   Smokeless tobacco: Never  Substance and Sexual Activity   Alcohol use: No   Drug use: No  Sexual activity: Not on file  Other Topics Concern   Not on file  Social History Narrative   Not on file   Social Determinants of Health   Financial Resource Strain: Not on file  Food Insecurity: Not on file  Transportation Needs: Not on file  Physical Activity: Not on file  Stress: Not on file  Social Connections: Not on file   Intimate Partner Violence: Not on file    FAMILY HISTORY: Family History  Problem Relation Age of Onset   Diabetes Other    Heart failure Other     ALLERGIES:  is allergic to tegaderm ag mesh [silver] and alpha-gal.  MEDICATIONS:  Current Outpatient Medications  Medication Sig Dispense Refill   EPINEPHrine 0.3 mg/0.3 mL IJ SOAJ injection Inject 0.3 mg into the muscle as needed for anaphylaxis. (Patient not taking: Reported on 10/05/2020) 1 each 0   No current facility-administered medications for this visit.    REVIEW OF SYSTEMS:   Constitutional: Denies fevers, chills or abnormal night sweats Eyes: Denies blurriness of vision, double vision or watery eyes Ears, nose, mouth, throat, and face: Denies mucositis or sore throat Respiratory: Denies cough, dyspnea or wheezes Cardiovascular: Denies palpitation, chest discomfort or lower extremity swelling Gastrointestinal:  Denies nausea, heartburn or change in bowel habits Skin: Denies abnormal skin rashes Lymphatics: Denies new lymphadenopathy or easy bruising Neurological:Denies numbness, tingling or new weaknesses Behavioral/Psych: Mood is stable, no new changes  All other systems were reviewed with the patient and are negative.  PHYSICAL EXAMINATION: ECOG PERFORMANCE STATUS: 0 - Asymptomatic  Vitals:   10/03/22 0954  BP: (!) 137/111  Pulse: 79  Resp: 18  Temp: 98.8 F (37.1 C)  SpO2: 100%   Filed Weights   10/03/22 0954  Weight: 177 lb 3.2 oz (80.4 kg)    GENERAL:alert, no distress and comfortable  NEURO: no focal motor/sensory deficits  LABORATORY DATA:  I have reviewed the data as listed Lab Results  Component Value Date   WBC 7.0 10/03/2022   HGB 14.7 10/03/2022   HCT 43.0 10/03/2022   MCV 87.2 10/03/2022   PLT 180 10/03/2022   Recent Labs    10/04/21 1120 10/03/22 0918  NA 137 137  K 4.0 3.9  CL 104 101  CO2 26 28  GLUCOSE 82 98  BUN 20 15  CREATININE 0.88 0.89  CALCIUM 9.8 9.8  GFRNONAA  >60 >60  PROT 8.5* 8.6*  ALBUMIN 4.5 4.3  AST 28 26  ALT 38 29  ALKPHOS 59 64  BILITOT 0.4 0.4    RADIOGRAPHIC STUDIES: I have reviewed prior CT imaging with the patient I have personally reviewed the radiological images as listed and agreed with the findings in the report.   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[place]) I have reviewed his case extensively I have reviewed the last imaging study with the patient I recommend consideration for 1 more CT imaging either this year or next year before we discharge him from the office I will call him with results of his tumor marker  Elevated BP without diagnosis of hypertension His blood pressure is intermittently elevated but he is not symptomatic I wonder it could be due to whitecoat hypertension I recommend the patient to start checking his blood pressure at home and to seek attention from primary care doctor if indeed he has new onset of hypertension  Orders Placed This Encounter  Procedures   CBC with Differential ([facility] Only)    Standing Status:  Future    Standing Expiration Date:   10/05/2023   CMP (Cancer Center only)    Standing Status:   Future    Standing Expiration Date:   10/05/2023   Lactate dehydrogenase    Standing Status:   Future    Standing Expiration Date:   10/05/2023   AFP tumor marker    Standing Status:   Future    Standing Expiration Date:   10/05/2023   Beta hCG quant (ref lab)    Standing Status:   Future    Standing Expiration Date:   10/05/2023    All questions were answered. The patient knows to call the clinic with any problems, questions or concerns. The total time spent in the appointment was 30 minutes encounter with patients including review of chart and various tests results, discussions about plan of care and coordination of care plan   Heath Lark, MD 10/04/2022 9:48 AM

## 2022-10-04 NOTE — Assessment & Plan Note (Signed)
I have reviewed his case extensively I have reviewed the last imaging study with the patient I recommend consideration for 1 more CT imaging either this year or next year before we discharge him from the office I will call him with results of his tumor marker

## 2022-10-05 LAB — AFP TUMOR MARKER: AFP, Serum, Tumor Marker: 3.2 ng/mL (ref 0.0–6.9)

## 2022-10-07 ENCOUNTER — Telehealth: Payer: Self-pay

## 2022-10-07 NOTE — Telephone Encounter (Signed)
Called and left a message that all his lab results are not back. Dr. Alvy Bimler will call him on Thursday or Friday when the office has his lab results. Ask him to call the office with questions.

## 2022-10-08 ENCOUNTER — Telehealth: Payer: Self-pay

## 2022-10-08 LAB — BETA HCG QUANT (REF LAB): hCG Quant: <1 m[IU]/mL (ref 0–3)

## 2022-10-08 NOTE — Telephone Encounter (Signed)
Attempted to call and unable to leave a message due to voicemail being full.  Per Dr. Alvy Bimler, all labs are normal. In the future he can keep appt as scheduled or we can do labs 1 week prior to MD appt.

## 2022-12-26 IMAGING — CT CT ABD-PELV W/ CM
2 of 5 series · 15 of 46 positions shown, 17 images · IV contrast (OMNIPAQUE)
Comparison: 10/04/2019

CLINICAL DATA: 39-year-old male with history of testicular cancer.
Restaging.

EXAM:
CT ABDOMEN AND PELVIS WITH CONTRAST
TECHNIQUE: Multidetector CT imaging of the abdomen and pelvis was performed
using the standard protocol following bolus administration of
intravenous contrast.
CONTRAST:  100mL OMNIPAQUE IOHEXOL 300 MG/ML  SOLN

[Series 2: axial st · axial · 0.75mm/px · z∈[-534,-84]mm · 12 of 102 slices shown, 14 images]
[im 6/102  soft-tissue]
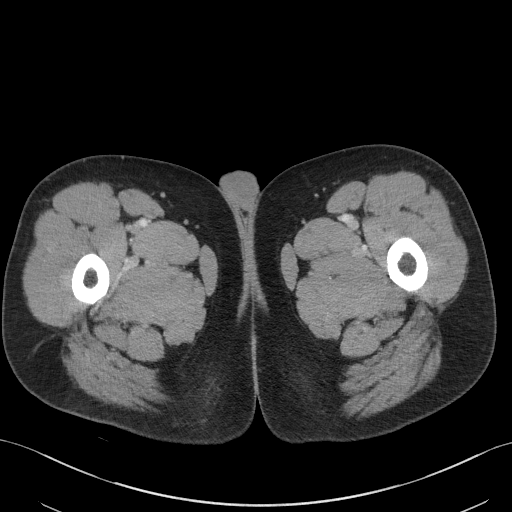
[im 6/102  bone]
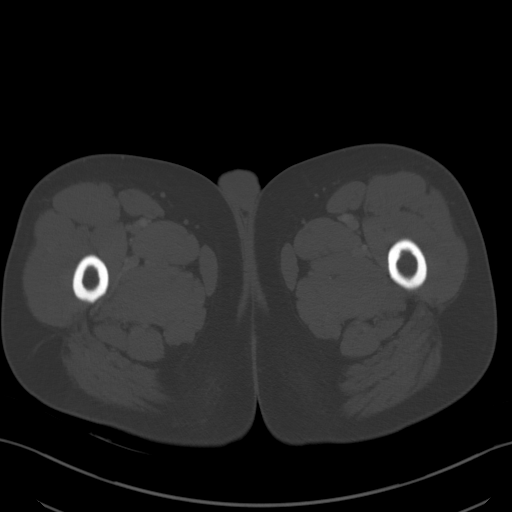
[im 18/102  soft-tissue]
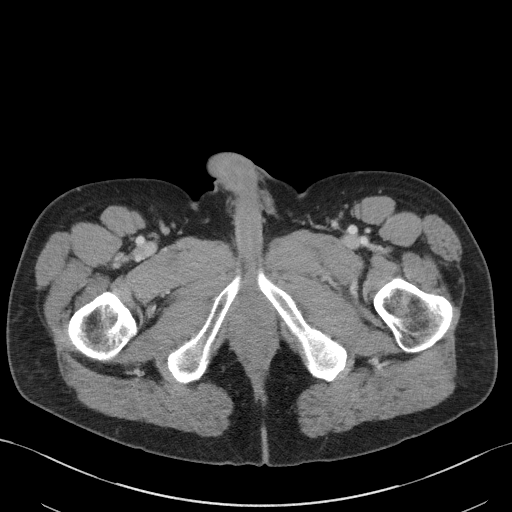
[im 24/102  soft-tissue]
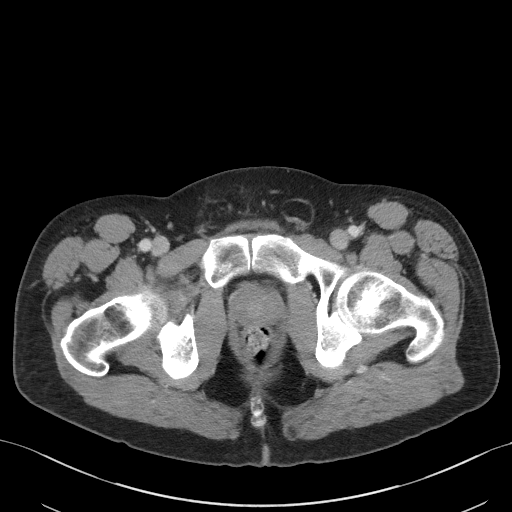
[im 30/102  soft-tissue]
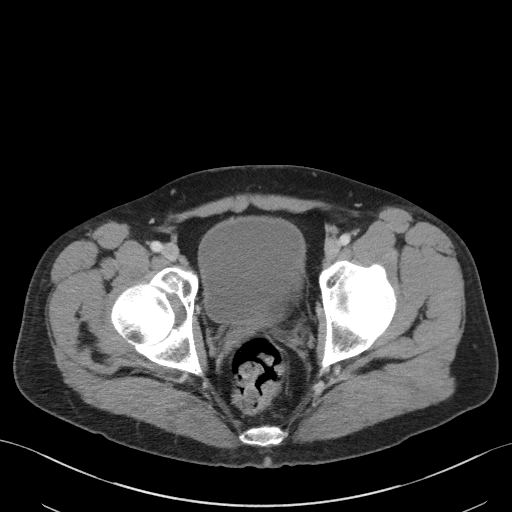
[im 42/102  soft-tissue]
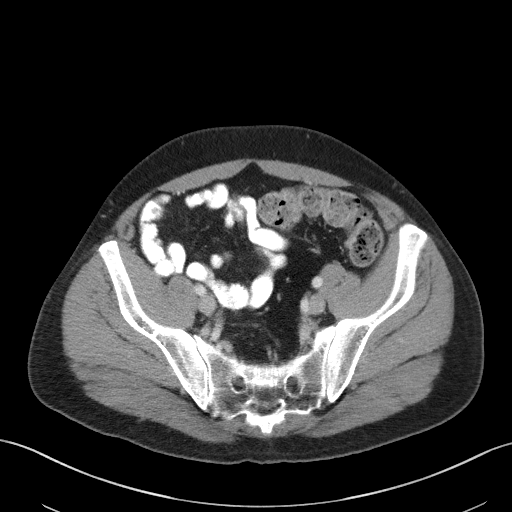
[im 48/102  soft-tissue]
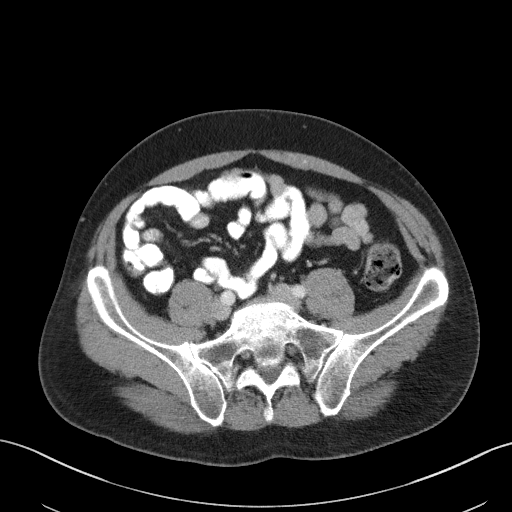
[im 54/102  soft-tissue]
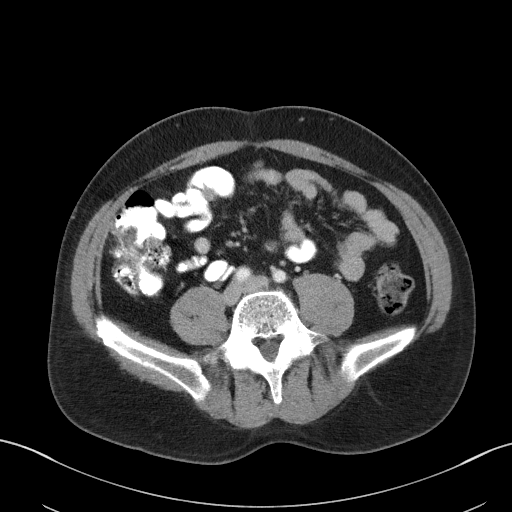
[im 66/102  soft-tissue]
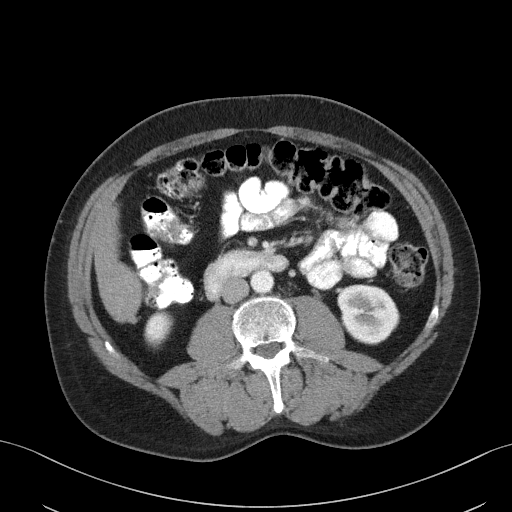
[im 72/102  soft-tissue]
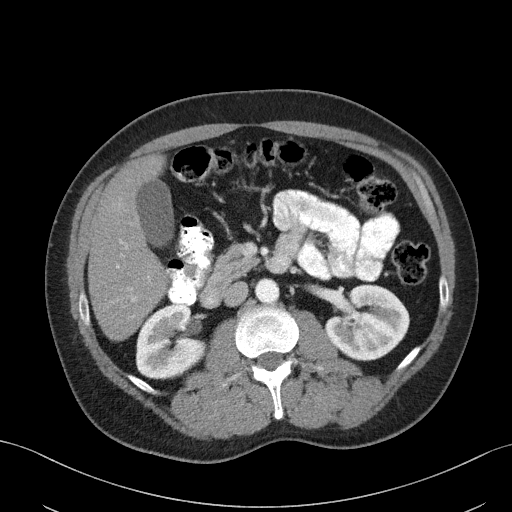
[im 72/102  bone]
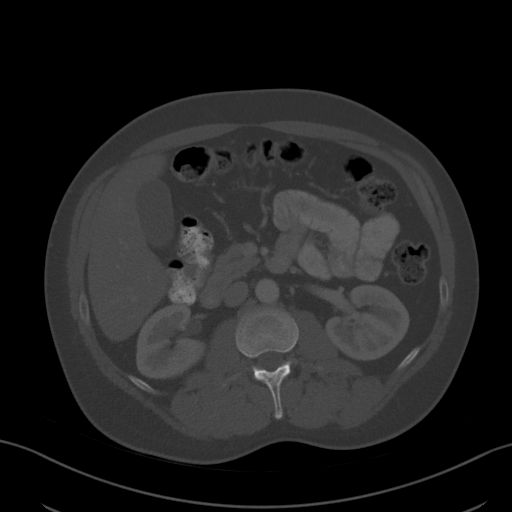
[im 78/102  soft-tissue]
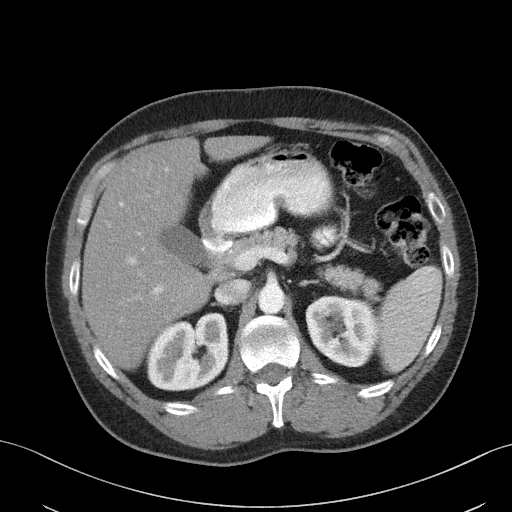
[im 90/102  soft-tissue]
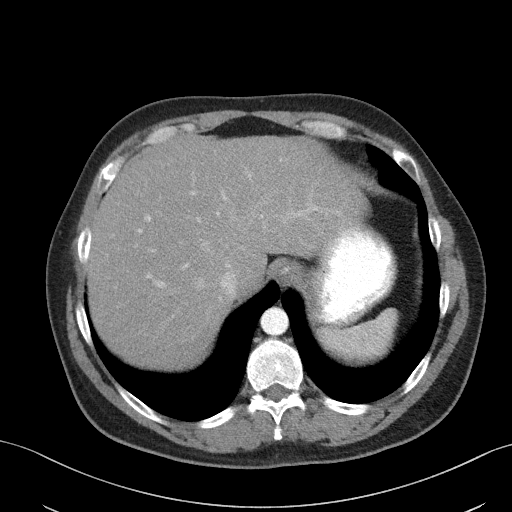
[im 96/102  soft-tissue]
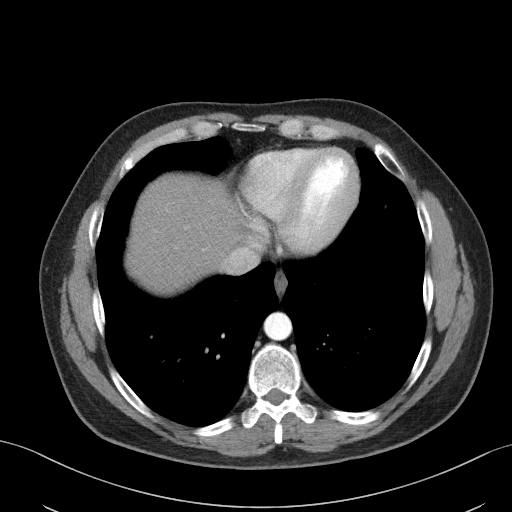

[Series 4: coronal st · coronal · 0.73mm/px · 3 of 85 slices shown]
[im 29/85  soft-tissue]
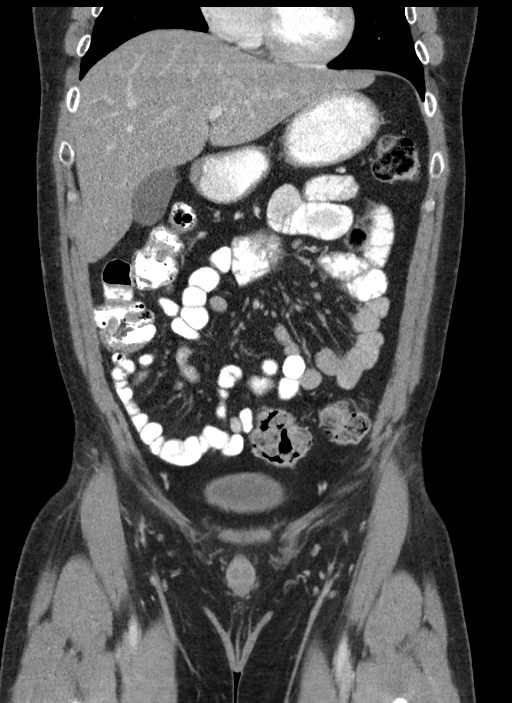
[im 38/85  soft-tissue]
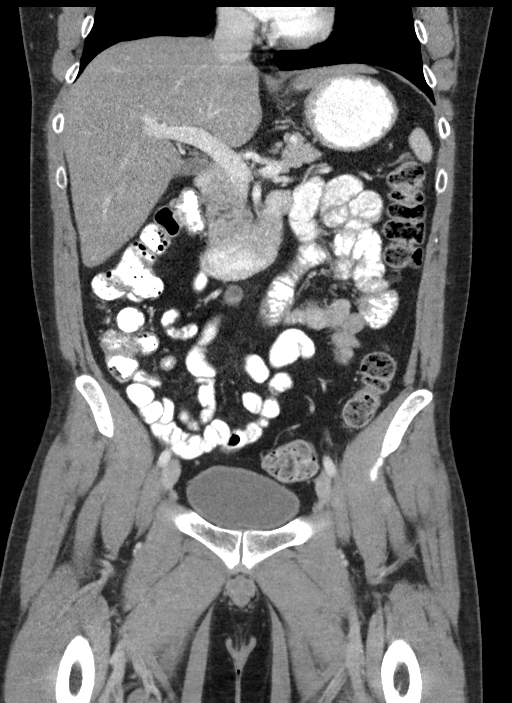
[im 47/85  soft-tissue]
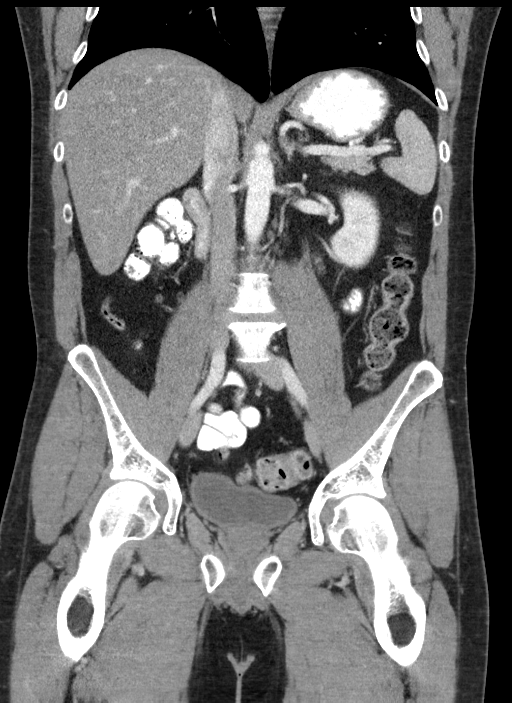

[15 of 46 positions shown; findings below may reference images not displayed]

FINDINGS: Lower chest: Unremarkable.

Hepatobiliary: No suspicious focal abnormality within the liver
parenchyma. There is no evidence for gallstones, gallbladder wall
thickening, or pericholecystic fluid. No intrahepatic or
extrahepatic biliary dilation.

Pancreas: No focal mass lesion. No dilatation of the main duct. No
intraparenchymal cyst. No peripancreatic edema.

Spleen: No splenomegaly. No focal mass lesion.

Adrenals/Urinary Tract: No adrenal nodule or mass. Kidneys
unremarkable. No evidence for hydroureter. The urinary bladder
appears normal for the degree of distention.

Stomach/Bowel: Stomach is unremarkable. No gastric wall thickening.
No evidence of outlet obstruction. Duodenum is normally positioned
as is the ligament of Treitz. No small bowel wall thickening. No
small bowel dilatation. The terminal ileum is normal. The appendix
is normal. No gross colonic mass. No colonic wall thickening.

Vascular/Lymphatic: No abdominal aortic aneurysm. No abdominal
aortic atherosclerotic calcification. Index portacaval node shows
continued further decrease in size measuring 1.8 x 1.4 cm today
compared to 2.0 x 1.7 cm previously. No new lymphadenopathy in the
abdomen. No pelvic sidewall lymphadenopathy.

Reproductive: The prostate gland and seminal vesicles are
unremarkable.

Other: No intraperitoneal free fluid.

Musculoskeletal: No worrisome lytic or sclerotic osseous
abnormality.
IMPRESSION: Continued further decrease in size of the portacaval lymph node. No
new or progressive findings in the abdomen or pelvis.

## 2023-10-06 ENCOUNTER — Inpatient Hospital Stay: Payer: Self-pay

## 2023-10-06 ENCOUNTER — Inpatient Hospital Stay: Payer: Self-pay | Admitting: Hematology and Oncology

## 2023-10-16 ENCOUNTER — Other Ambulatory Visit: Payer: Self-pay

## 2023-10-16 ENCOUNTER — Encounter (HOSPITAL_COMMUNITY): Payer: Self-pay

## 2023-10-16 ENCOUNTER — Emergency Department (HOSPITAL_COMMUNITY): Payer: Self-pay

## 2023-10-16 ENCOUNTER — Emergency Department (HOSPITAL_COMMUNITY)
Admission: EM | Admit: 2023-10-16 | Discharge: 2023-10-16 | Disposition: A | Discharge status: Home / Self Care | Payer: Self-pay | Attending: Emergency Medicine | Admitting: Emergency Medicine

## 2023-10-16 DIAGNOSIS — W19XXXA Unspecified fall, initial encounter: Secondary | ICD-10-CM | POA: Insufficient documentation

## 2023-10-16 DIAGNOSIS — S42251A Displaced fracture of greater tuberosity of right humerus, initial encounter for closed fracture: Secondary | ICD-10-CM | POA: Insufficient documentation

## 2023-10-16 DIAGNOSIS — Z8547 Personal history of malignant neoplasm of testis: Secondary | ICD-10-CM | POA: Insufficient documentation

## 2023-10-16 DIAGNOSIS — Y9301 Activity, walking, marching and hiking: Secondary | ICD-10-CM | POA: Insufficient documentation

## 2023-10-16 DIAGNOSIS — S4291XA Fracture of right shoulder girdle, part unspecified, initial encounter for closed fracture: Secondary | ICD-10-CM

## 2023-10-16 NOTE — ED Triage Notes (Signed)
 Pt presents to ED from home with c/o shoulder pain that started after dog took off on leash and pulled pt down hyperextending right shoulder and falling on it, pt says he heard crunch sound, says he doesn't want any pain meds other than lidocaine  and ibuprofen .

## 2023-10-17 NOTE — ED Provider Notes (Signed)
 [place] EMERGENCY DEPARTMENT AT [facility] Provider Note   CSN: [id number] Arrival date & time: 10/16/23  1942     History  Chief Complaint  Patient presents with   poss shoulder dislocation    Right shoulder    Jeremy Clay is a 43 y.o. male.  HPI Patient presents with right shoulder pain after fall.  States he was walking a dog and his arm was up when he fell landing on his right side.  Complaining of pain in right shoulder.  States he felt a cracking.  Requesting not to have narcotic pain meds since he is in recovery.  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  Previous history of opiate abuse.  No numbness or weakness.  No other real pain.   Past Medical History:  Diagnosis Date   Herniated disc    Metastasis to lymph nodes ([facility]) 02/07/2014   2.8cm node at the aortic bifurcation.    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[facility]) 02/07/2014   Testicular mass    right    Home Medications Prior to Admission medications   Medication Sig Start Date End Date Taking? Authorizing Provider  EPINEPHrine  0.3 mg/0.3 mL IJ SOAJ injection Inject 0.3 mg into the muscle as needed for anaphylaxis. Patient not taking: Reported on 10/05/2020 09/19/20   Phebe Fonda RAMAN, MD      Allergies    Tegaderm ag mesh [silver] and Alpha-gal    Review of Systems   Review of Systems  Physical Exam Updated Vital Signs BP (!) 169/100 (BP Location: Left Arm)   Pulse 89   Temp 98.8 F (37.1 C) (Oral)   Resp 16   Ht 5' 10 (1.778 m)   Wt 80.4 kg   SpO2 98%   BMI 25.43 kg/m  Physical Exam Vitals and nursing note reviewed.  HENT:     Head: Atraumatic.  Chest:     Chest wall: No tenderness.  Musculoskeletal:        General: Tenderness present.     Cervical back: Neck supple. No tenderness.     Comments: No tenderness over right wrist or elbow.  Does have tenderness over right shoulder approximately.  Some swelling but no hollow areas.  Neurological:     Mental Status: He is alert and oriented to person,  place, and time.     ED Results / Procedures / Treatments   Labs (all labs ordered are listed, but only abnormal results are displayed) Labs Reviewed - No data to display  EKG None  Radiology CT Shoulder Right Wo Contrast Result Date: 10/16/2023 CLINICAL DATA:  Fall, fracture, shoulder pain EXAM: CT OF THE UPPER RIGHT EXTREMITY WITHOUT CONTRAST TECHNIQUE: Multidetector CT imaging of the upper right extremity was performed according to the standard protocol. RADIATION DOSE REDUCTION: This exam was performed according to the departmental dose-optimization program which includes automated exposure control, adjustment of the mA and/or kV according to patient size and/or use of iterative reconstruction technique. COMPARISON:  Plain films today FINDINGS: Bones/Joint/Cartilage Fracture noted in the proximal right humerus involving the humeral neck and lateral humeral head in the region of the greater tuberosity. Minimally displaced greater tuberosity. Mild comminution. No subluxation or dislocation. Ligaments Suboptimally assessed by CT. Muscles and Tendons Unremarkable Soft tissues Unremarkable IMPRESSION: Mildly comminuted and minimally displaced fracture in the region of the greater tuberosity. Nondisplaced fracture across the humeral neck. Electronically Signed   By: Franky Crease M.D.   On: 10/16/2023 22:10   DG Shoulder Right Result Date:  10/16/2023 CLINICAL DATA:  [place], injury, right shoulder pain EXAM: RIGHT SHOULDER - 2+ VIEW COMPARISON:  None Available. FINDINGS: Internal rotation, external rotation, and transscapular views of the right shoulder are obtained. There is a comminuted intra-articular fracture through the right humeral head and neck, with mild displacement of the fracture fragment through the greater tuberosity of the proximal right humerus. Alignment is near anatomic. No dislocation. Superficial soft tissue swelling overlying the proximal right humerus. Right chest is clear. IMPRESSION:  1. Comminuted intra-articular right humeral head and neck fracture, with near anatomic alignment. No dislocation. 2. Superficial soft tissue swelling. Electronically Signed   By: Ozell Daring M.D.   On: 10/16/2023 21:23    Procedures Procedures    Medications Ordered in ED Medications - No data to display  ED Course/ Medical Decision Making/ A&P                                 Medical Decision Making Amount and/or Complexity of Data Reviewed Radiology: ordered.   Patient with fall.  Right shoulder pain.  Differential diagnose includes fractures and dislocations.  Has initial x-ray that per my interpretation shows greater trochanteric fracture.  CT scan done to further evaluate.  Does show greater trochanteric fracture along with femoral neck fracture.  Will immobilize and have follow-up with Ortho.  Appears stable for discharge home.  No neurodeficits.        Final Clinical Impression(s) / ED Diagnoses Final diagnoses:  Fall, initial encounter  Closed fracture of right shoulder, initial encounter    Rx / DC Orders ED Discharge Orders     None         Patsey Lot, MD 10/17/23 [phone]

## 2023-10-27 ENCOUNTER — Encounter: Payer: Self-pay | Admitting: Hematology and Oncology

## 2023-10-27 ENCOUNTER — Inpatient Hospital Stay: Payer: Self-pay | Attending: Hematology and Oncology

## 2023-10-27 ENCOUNTER — Inpatient Hospital Stay: Payer: Self-pay | Admitting: Hematology and Oncology

## 2023-11-12 ENCOUNTER — Ambulatory Visit (INDEPENDENT_AMBULATORY_CARE_PROVIDER_SITE_OTHER): Payer: Self-pay | Admitting: Orthopedic Surgery

## 2023-11-12 ENCOUNTER — Other Ambulatory Visit: Payer: Self-pay

## 2023-11-12 ENCOUNTER — Encounter: Payer: Self-pay | Admitting: Orthopedic Surgery

## 2023-11-12 VITALS — BP 94/60 | HR 80 | Ht 70.0 in | Wt 179.0 lb

## 2023-11-12 DIAGNOSIS — S42294A Other nondisplaced fracture of upper end of right humerus, initial encounter for closed fracture: Secondary | ICD-10-CM

## 2023-11-12 NOTE — Progress Notes (Signed)
  Intake history:  BP 94/60   Pulse 80   Ht 5\' 10"  (1.778 m)   Wt 179 lb (81.2 kg)   BMI 25.68 kg/m  Body mass index is 25.68 kg/m.    WHAT ARE WE SEEING YOU FOR TODAY?   right shoulder  How long has this bothered you? (DOI?DOS?WS?)  on 10/16/23  Anticoag.  No  Diabetes No  Heart disease No  Hypertension No  SMOKING HX No  Kidney disease No  Any ALLERGIES _______ No ______________________________________   Treatment:  Have you taken:  Tylenol No  Advil Yes  Had PT No  Had injection No  Other  _________________________

## 2023-11-12 NOTE — Progress Notes (Signed)
   Subjective:     Patient ID: Jeremy Clay, male   DOB: 05/10/1981, 43 y.o.   MRN: [id number]  Chief Complaint  Patient presents with   Shoulder Injury    Right dog pulled off porch on 10/16/23     43 yo male presents with a right proximal humerus fracture secondary to falling while walking his dog  The injury was about 4 weeks ago seems to be getting better  No treatment to this point other than the sling and immobilization and Advil     Review of Systems  Constitutional:  Negative for chills and fever.  Respiratory:  Negative for shortness of breath.   Cardiovascular:  Negative for chest pain.       Objective:   Physical Exam Vitals and nursing note reviewed. Exam conducted with a chaperone present.  Constitutional:      General: He is not in acute distress.    Appearance: Normal appearance. He is normal weight. He is not ill-appearing, toxic-appearing or diaphoretic.  HENT:     Head: Normocephalic and atraumatic.  Eyes:     General: No scleral icterus.       Right eye: No discharge.        Left eye: No discharge.     Extraocular Movements: Extraocular movements intact.     Pupils: Pupils are equal, round, and reactive to light.  Cardiovascular:     Rate and Rhythm: Normal rate.     Pulses: Normal pulses.  Pulmonary:     Effort: Pulmonary effort is normal.  Skin:    General: Skin is warm and dry.     Capillary Refill: Capillary refill takes less than 2 seconds.  Neurological:     General: No focal deficit present.     Mental Status: He is alert and oriented to person, place, and time.     Sensory: No sensory deficit.     Gait: Gait normal.  Psychiatric:        Mood and Affect: Mood normal.    My reading of the images: Images at the hospital show a proximal humerus fracture including greater tuberosity with extension into the shaft nondisplaced    Assessment:     DG Shoulder Right Result Date: 11/12/2023 X-ray report Chief complaint frx care fu Images  4 v right shoulder Reading: proximal humerus fracture/non displaced No dislocation Impression: healed prox hum frx incl gr tuberosity       Plan:      Encounter Diagnosis  Name Primary?   Other closed nondisplaced fracture of proximal end of right humerus, initial encounter Yes    Start Codman exercises and passive range of motion Weeks 0 through 4  Weeks 5 through 8 Thera-Band exercises  Avoid heavy lifting   Okay to remove sling   Follow-up 6 weeks check range of motion

## 2023-11-23 ENCOUNTER — Ambulatory Visit (HOSPITAL_COMMUNITY): Payer: Self-pay

## 2023-11-23 NOTE — Progress Notes (Signed)
 Yes and appropriate fracture care code

## 2023-11-24 ENCOUNTER — Encounter (HOSPITAL_COMMUNITY): Payer: Self-pay | Admitting: Occupational Therapy

## 2023-11-24 ENCOUNTER — Ambulatory Visit (HOSPITAL_COMMUNITY): Payer: Self-pay | Attending: Orthopedic Surgery | Admitting: Occupational Therapy

## 2023-11-24 DIAGNOSIS — S42294A Other nondisplaced fracture of upper end of right humerus, initial encounter for closed fracture: Secondary | ICD-10-CM | POA: Insufficient documentation

## 2023-11-24 DIAGNOSIS — R29898 Other symptoms and signs involving the musculoskeletal system: Secondary | ICD-10-CM | POA: Insufficient documentation

## 2023-11-24 DIAGNOSIS — M25611 Stiffness of right shoulder, not elsewhere classified: Secondary | ICD-10-CM | POA: Insufficient documentation

## 2023-11-24 DIAGNOSIS — M25511 Pain in right shoulder: Secondary | ICD-10-CM | POA: Insufficient documentation

## 2023-11-24 NOTE — Patient Instructions (Signed)
 1) SHOULDER: Flexion On Table   Place hands on towel placed on table, elbows straight. Lean forward with you upper body, pushing towel away from body.  ___ reps per set, ___ sets per day  2) Abduction (Passive)   With arm out to side, resting on towel placed on table with palm DOWN, keeping trunk away from table, lean to the side while pushing towel away from body.  Repeat ____ times. Do ____ sessions per day.  Copyright  VHI. All rights reserved.     3) Internal Rotation (Assistive)   Seated with elbow bent at right angle and held against side, slide arm on table surface in an inward arc keeping elbow anchored in place. Repeat ____ times. Do ____ sessions per day. Activity: Use this motion to brush crumbs off the table.  Copyright  VHI. All rights reserved.    Perform each exercise ____10-15____ reps. 2-3x days.   1) Protraction   Start by holding a wand or cane at chest height.  Next, slowly push the wand outwards in front of your body so that your elbows become fully straightened. Then, return to the original position.     2) Shoulder FLEXION   In the standing position, hold a wand/cane with both arms, palms down on both sides. Raise up the wand/cane allowing your unaffected arm to perform most of the effort. Your affected arm should be partially relaxed.      3) Internal/External ROTATION   In the standing position, hold a wand/cane with both hands keeping your elbows bent. Move your arms and wand/cane to one side.  Your affected arm should be partially relaxed while your unaffected arm performs most of the effort.       4) Shoulder ABDUCTION   While holding a wand/cane palm face up on the injured side and palm face down on the uninjured side, slowly raise up your injured arm to the side.        5) Horizontal Abduction/Adduction      Straight arms holding cane at shoulder height, bring cane to right, center, left. Repeat starting to  left.   Copyright  VHI. All rights reserved.

## 2023-11-24 NOTE — Therapy (Signed)
 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HCC) 02/07/2014   Testicular mass    right   Past Surgical History:  Procedure Laterality Date   ORCHIECTOMY Right 02/07/2014   Procedure: RIGHT RADICAL ORCHIECTOMY;  Surgeon: Anner Crete, MD;  Location: [facility];  Service: Urology;  Laterality: Right;   Patient Active Problem List   Diagnosis Date Noted   Elevated BP without diagnosis of hypertension 10/04/2022   Fatigue 03/23/2014   Weight loss 03/23/2014   Altered taste 03/23/2014   Nausea alone 03/23/2014   Diarrhea 03/23/2014   Mucositis due to antineoplastic therapy 03/23/2014   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HCC) 02/07/2014   Sprain of ankle 10/11/2008    PCP: [facility] REFERRING PROVIDER: Ty Hilts, MD  ONSET DATE: 10/16/23  REFERRING DIAG: U13.244W (ICD-10-CM) - Other closed nondisplaced fracture of proximal end of right humerus, initial encounter   THERAPY DIAG:  Acute pain of right shoulder  Shoulder stiffness, right  Other symptoms and signs involving the musculoskeletal system  Rationale for Evaluation and Treatment: Rehabilitation  SUBJECTIVE:   SUBJECTIVE STATEMENT: "Its getting better" Pt accompanied by: self  PERTINENT HISTORY: Pt was walking his dog when the dog pulled him down  off his porch, fracturing his humeral head in 2 places. 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    PRECAUTIONS: Shoulder  WEIGHT BEARING RESTRICTIONS: Yes 5lbs  PAIN:  Are you having pain? Yes: NPRS scale: 1/10 Pain location: RUE - bicep, pectoralis Pain description: dull ache Aggravating factors: movement Relieving factors: rest  FALLS: Has patient fallen in last 6 months? Yes. Number of falls 1  PLOF: Independent  PATIENT GOALS: To improve ROM and strength  NEXT MD VISIT: 12/24/23  OBJECTIVE:   HAND DOMINANCE: Right  ADLs: Overall ADLs: Pt requires increased time and difficulty for dressing and bathing due to being unable to lift his arm above chest height. Pt unable to lift items for cooking/cleaning/yard work due to lifting restrictions and pain.   FUNCTIONAL OUTCOME MEASURES: Upper Extremity Functional Scale (UEFS): 34/80 - 42.5%  UPPER EXTREMITY ROM:       Assessed in seated, er/IR adducted  Active ROM Right eval  Shoulder flexion 102  Shoulder abduction 105  Shoulder internal rotation 90  Shoulder external rotation 27  (Blank rows = not tested)    UPPER EXTREMITY MMT:     Assessed in seated, er/IR adducted  MMT Right eval  Shoulder flexion 4-/5  Shoulder abduction 4-/5  Shoulder internal rotation 5/5  Shoulder external rotation 4-/5  (Blank rows = not tested)  SENSATION: WFL  EDEMA: No swelling noted   OBSERVATIONS: moderate fascial  restrictions along the pectoralis, biceps, deltoid, and scapular region   TODAY'S TREATMENT:                                                                                                                              DATE:   11/24/23 -Evaluation -measurements -Tables slides: flexion, abduction, x10 -AA/ROM: seated, flexion, abduction, protraction, horizontal abduction, er/IR, x10   PATIENT EDUCATION: Education details: Table Slides and AA/ROM Person educated:  Patient Education method: Explanation, Demonstration, and Handouts Education comprehension: verbalized understanding and returned demonstration  HOME EXERCISE PROGRAM: 3/18: Table Slides and AA/ROM  GOALS: Goals reviewed with patient? Yes   SHORT TERM GOALS: Target date: 01/15/24  Pt will be provided with and educated on HEP to improve mobility in RUE required for use during ADL completion.   Goal status: INITIAL   LONG TERM GOALS: Target date: 01/15/24  Pt will decrease pain in RUE to 3/10 or less to improve ability to sleep for 2+ consecutive hours without waking due to pain.   Goal status: INITIAL  2.  Pt will decrease RUE fascial restrictions to min amounts or less to improve mobility required for functional reaching tasks.   Goal status: INITIAL  3.  Pt will increase RUE A/ROM by 25 degrees to improve ability to use RUE when reaching overhead or behind back during dressing and bathing tasks.   Goal status: INITIAL  4.  Pt will increase RUE strength to 5/5 or greater to improve ability to use RUE when lifting or carrying items during meal preparation/housework/yardwork tasks.   Goal status: INITIAL  5.  Pt will return to highest level of function using RUE as dominant during functional task completion.   Goal status: INITIAL   ASSESSMENT:  CLINICAL IMPRESSION: Patient is a 43 y.o. male who was seen today for occupational therapy evaluation for R humerus fracture. Pt presents with increased pain and fascial restrictions, decreased ROM, strength, and functional use of the RUE.   PERFORMANCE DEFICITS: in functional skills including in functional skills including ADLs, IADLs, coordination, tone, ROM, strength, pain, fascial restrictions, muscle spasms, and UE functional use.  IMPAIRMENTS: are limiting patient from ADLs, IADLs, rest and sleep, work, leisure, and social participation.   COMORBIDITIES: has no other co-morbidities that affects occupational performance.  Patient will benefit from skilled OT to address above impairments and improve overall function.  MODIFICATION OR ASSISTANCE TO COMPLETE EVALUATION: No modification of tasks or assist necessary to complete an evaluation.  OT OCCUPATIONAL PROFILE AND HISTORY: Problem focused assessment: Including review of records relating to presenting problem.  CLINICAL DECISION MAKING: LOW - limited treatment options, no task modification necessary  REHAB POTENTIAL: Good  EVALUATION COMPLEXITY: Low      PLAN:  OT FREQUENCY: 1x/week  OT DURATION: 6 weeks  PLANNED INTERVENTIONS: 97168 OT Re-evaluation, 97535 self care/ADL training, 16109 therapeutic exercise, 97530 therapeutic activity, 97112 neuromuscular re-education, 97140 manual therapy, 97035 ultrasound, 97010 moist heat, 97032 electrical stimulation (  manual), passive range of motion, functional mobility training, energy conservation, coping strategies training, patient/family education, and DME and/or AE instructions  RECOMMENDED OTHER SERVICES: N/A  CONSULTED AND AGREED WITH PLAN OF CARE: Patient  PLAN FOR NEXT SESSION: Manual Therapy, P/ROM, AA/ROM, Isometrics, Pulleys  [place] Bing Plume, OTR/L [facility] [phone] Kennyth Arnold, OT 11/24/2023, 12:42 PM

## 2023-12-02 ENCOUNTER — Encounter (HOSPITAL_COMMUNITY): Payer: Self-pay | Admitting: Occupational Therapy

## 2023-12-02 ENCOUNTER — Ambulatory Visit (HOSPITAL_COMMUNITY): Payer: Self-pay | Admitting: Occupational Therapy

## 2023-12-02 DIAGNOSIS — R29898 Other symptoms and signs involving the musculoskeletal system: Secondary | ICD-10-CM

## 2023-12-02 DIAGNOSIS — M25611 Stiffness of right shoulder, not elsewhere classified: Secondary | ICD-10-CM

## 2023-12-02 DIAGNOSIS — M25511 Pain in right shoulder: Secondary | ICD-10-CM

## 2023-12-02 NOTE — Therapy (Signed)
 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HCC) 02/07/2014   Testicular mass    right   Past Surgical History:  Procedure Laterality Date   ORCHIECTOMY Right 02/07/2014   Procedure: RIGHT RADICAL ORCHIECTOMY;  Surgeon: Anner Crete, MD;  Location: [facility];  Service: Urology;  Laterality: Right;   Patient Active Problem List   Diagnosis Date Noted   Elevated BP without diagnosis of hypertension 10/04/2022   Fatigue 03/23/2014   Weight loss 03/23/2014   Altered taste 03/23/2014   Nausea alone 03/23/2014   Diarrhea 03/23/2014   Mucositis due to antineoplastic therapy 03/23/2014   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HCC) 02/07/2014   Sprain of ankle 10/11/2008    PCP: [facility] REFERRING PROVIDER: Ty Hilts, MD  ONSET DATE: 10/16/23  REFERRING DIAG: W09.811B (ICD-10-CM) - Other closed nondisplaced fracture of proximal end of right humerus, initial encounter   THERAPY DIAG:  Acute pain of right shoulder  Shoulder stiffness, right  Other symptoms and signs involving the musculoskeletal system  Rationale for Evaluation and Treatment: Rehabilitation  SUBJECTIVE:   SUBJECTIVE STATEMENT: "It feels much better" Pt accompanied by: self  PERTINENT HISTORY: Pt was walking his dog when the dog pulled him  down off his porch, fracturing his humeral head in 2 places. 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    PRECAUTIONS: Shoulder  WEIGHT BEARING RESTRICTIONS: Yes 5lbs  PAIN:  Are you having pain? Yes: NPRS scale: 1/10 Pain location: RUE - bicep, pectoralis Pain description: dull ache Aggravating factors: movement Relieving factors: rest  FALLS: Has patient fallen in last 6 months? Yes. Number of falls 1  PLOF: Independent  PATIENT GOALS: To improve ROM and strength  NEXT MD VISIT: 12/24/23  OBJECTIVE:   HAND DOMINANCE: Right  ADLs: Overall ADLs: Pt requires increased time and difficulty for dressing and bathing due to being unable to lift his arm above chest height. Pt unable to lift items for cooking/cleaning/yard work due to lifting restrictions and pain.   FUNCTIONAL OUTCOME MEASURES: Upper Extremity Functional Scale (UEFS): 34/80 - 42.5%  UPPER EXTREMITY ROM:       Assessed in seated, er/IR adducted  Active ROM Right eval  Shoulder flexion 102  Shoulder abduction 105  Shoulder internal rotation 90  Shoulder external rotation 27  (Blank rows = not tested)    UPPER EXTREMITY MMT:     Assessed in seated, er/IR adducted  MMT Right eval  Shoulder flexion 4-/5  Shoulder abduction 4-/5  Shoulder internal rotation 5/5  Shoulder external rotation 4-/5  (Blank rows = not tested)  SENSATION: WFL  EDEMA: No swelling noted   OBSERVATIONS:  moderate fascial restrictions along the pectoralis, biceps, deltoid, and scapular region   TODAY'S TREATMENT:                                                                                                                              DATE:   12/02/23 -Manual Therapy: myofascial release and trigger point applied to biceps, pectoralis, trapezius, and scapular region in order to reduce fascial restrictions and pain, as well as improve ROM.  -P/ROM: flexion, abduction, er/IR, x10 -AA/ROM:  supine, flexion, abduction, protraction, horizontal abduction, er/IR, x10 -Isometrics: flexion, abduction, extension, IR, 4x10-15" -Wall Climbs: flexion, x10 -UBE: level 2, 2' forwards and backwards  11/24/23 -Evaluation -measurements -Tables slides: flexion, abduction, x10 -AA/ROM: seated, flexion, abduction, protraction, horizontal abduction, er/IR, x10   PATIENT EDUCATION: Education details: Table Slides and AA/ROM Person educated: Patient Education method: Explanation, Demonstration, and Handouts Education comprehension: verbalized understanding and returned demonstration  HOME EXERCISE PROGRAM: 3/18: Table Slides and AA/ROM  GOALS: Goals reviewed with patient? Yes   SHORT TERM GOALS: Target date: 01/15/24  Pt will be provided with and educated on HEP to improve mobility in RUE required for use during ADL completion.   Goal status: IN PROGRESS   LONG TERM GOALS: Target date: 01/15/24  Pt will decrease pain in RUE to 3/10 or less to improve ability to sleep for 2+ consecutive hours without waking due to pain.   Goal status: IN PROGRESS  2.  Pt will decrease RUE fascial restrictions to min amounts or less to improve mobility required for functional reaching tasks.   Goal status: IN PROGRESS  3.  Pt will increase RUE A/ROM by 25 degrees to improve ability to use RUE when reaching overhead or behind back during dressing and bathing tasks.   Goal status: IN PROGRESS  4.  Pt will increase RUE strength to 5/5 or greater to improve ability to use RUE when lifting or carrying items during meal preparation/housework/yardwork tasks.   Goal status: IN PROGRESS  5.  Pt will return to highest level of function using RUE as dominant during functional task completion.   Goal status: IN PROGRESS   ASSESSMENT:  CLINICAL IMPRESSION: Patient reporting decreased pain this session and feeling like he is overall moving better. He is able to achieve approximately 75% of full ROM with  AA/ROM in supine/reclined position with no pain, only tightness in his end ranges. OT added isometrics and wall climbs for proximal strengthening/stability and stretching to encourage improved range. Verbal and tactile cuing provided for positioning and technique.   PERFORMANCE DEFICITS: in functional skills including in functional skills including ADLs, IADLs, coordination, tone, ROM, strength, pain, fascial restrictions, muscle spasms, and UE functional use.   PLAN:  OT FREQUENCY: 1x/week  OT DURATION: 6 weeks  PLANNED INTERVENTIONS: 97168 OT Re-evaluation, 97535 self care/ADL training, 16109 therapeutic exercise, 97530 therapeutic activity, 97112 neuromuscular re-education, 97140 manual therapy, 97035 ultrasound, 97010 moist heat, 97032 electrical stimulation (manual), passive range  of motion, functional mobility training, energy conservation, coping strategies training, patient/family education, and DME and/or AE instructions  RECOMMENDED OTHER SERVICES: N/A  CONSULTED AND AGREED WITH PLAN OF CARE: Patient  PLAN FOR NEXT SESSION: Manual Therapy, P/ROM, AA/ROM, Isometrics, Pulleys  Priyanka Causey Bing Plume, OTR/L [facility] [phone] Kennyth Arnold, OT 12/02/2023, 9:35 AM

## 2023-12-02 NOTE — Patient Instructions (Signed)
  Complete the following 1-2 a day. Hold for 10-15 seconds. Complete 4-6 sets for each.   1) SHOULDER - ISOMETRIC FLEXION  Gently push your fist forward into a wall with your elbow bent.    2) SHOULDER - ISOMETRIC EXTENSION  Gently push your a bent elbow back into a wall.    3) SHOULDER - ISOMETRIC INTERNAL ROTATION   Gently press your hand into a wall using the palm side of your hand.  Maintain a bent elbow the entire time.        4) SHOULDER - ISOMETRIC ADDUCTION  Gently push your elbow into the side of your body.   5) SHOULDER - ISOMETRIC ABDUCTION  Gently push your elbow out to the side into a wall with your elbow bent.

## 2023-12-09 ENCOUNTER — Encounter (HOSPITAL_COMMUNITY): Payer: Self-pay | Admitting: Occupational Therapy

## 2023-12-16 ENCOUNTER — Encounter (HOSPITAL_COMMUNITY): Payer: Self-pay | Admitting: Occupational Therapy

## 2023-12-23 DIAGNOSIS — S42201D Unspecified fracture of upper end of right humerus, subsequent encounter for fracture with routine healing: Secondary | ICD-10-CM | POA: Insufficient documentation

## 2023-12-24 ENCOUNTER — Encounter: Payer: Self-pay | Admitting: Orthopedic Surgery

## 2023-12-24 DIAGNOSIS — S42294D Other nondisplaced fracture of upper end of right humerus, subsequent encounter for fracture with routine healing: Secondary | ICD-10-CM
